# Patient Record
Sex: Male | Born: 1960 | Race: Black or African American | Hispanic: No | Marital: Married | Smoking: Former smoker
Health system: Southern US, Community
[De-identification: ages and names within clinical notes are randomized; demographics above are authoritative.]

## PROBLEM LIST (undated history)

## (undated) DIAGNOSIS — H5210 Myopia, unspecified eye: Secondary | ICD-10-CM

## (undated) DIAGNOSIS — F32A Depression, unspecified: Secondary | ICD-10-CM

## (undated) DIAGNOSIS — M1990 Unspecified osteoarthritis, unspecified site: Secondary | ICD-10-CM

## (undated) DIAGNOSIS — M4800 Spinal stenosis, site unspecified: Secondary | ICD-10-CM

## (undated) DIAGNOSIS — N529 Male erectile dysfunction, unspecified: Secondary | ICD-10-CM

## (undated) DIAGNOSIS — G629 Polyneuropathy, unspecified: Secondary | ICD-10-CM

## (undated) DIAGNOSIS — I10 Essential (primary) hypertension: Secondary | ICD-10-CM

## (undated) DIAGNOSIS — R7303 Prediabetes: Secondary | ICD-10-CM

## (undated) DIAGNOSIS — G8929 Other chronic pain: Secondary | ICD-10-CM

## (undated) DIAGNOSIS — N400 Enlarged prostate without lower urinary tract symptoms: Secondary | ICD-10-CM

## (undated) HISTORY — PX: WISDOM TOOTH EXTRACTION: SHX21

## (undated) HISTORY — PX: HERNIA REPAIR: SHX51

## (undated) HISTORY — PX: BACK SURGERY: SHX140

---

## 2019-06-23 ENCOUNTER — Other Ambulatory Visit: Payer: Self-pay

## 2019-06-23 DIAGNOSIS — Z20822 Contact with and (suspected) exposure to covid-19: Secondary | ICD-10-CM

## 2019-06-25 LAB — NOVEL CORONAVIRUS, NAA: SARS-CoV-2, NAA: NOT DETECTED

## 2019-06-26 ENCOUNTER — Telehealth: Payer: Self-pay | Admitting: General Practice

## 2019-06-26 NOTE — Telephone Encounter (Signed)
Negative COVID results given. Patient results "NOT Detected." Caller expressed understanding. ° °

## 2019-07-01 ENCOUNTER — Ambulatory Visit (HOSPITAL_COMMUNITY)
Admission: EM | Admit: 2019-07-01 | Discharge: 2019-07-01 | Disposition: A | Discharge status: Home / Self Care | Payer: Medicare Other | Attending: Family Medicine | Admitting: Family Medicine

## 2019-07-01 ENCOUNTER — Other Ambulatory Visit: Payer: Self-pay

## 2019-07-01 ENCOUNTER — Encounter (HOSPITAL_COMMUNITY): Payer: Self-pay

## 2019-07-01 DIAGNOSIS — M5441 Lumbago with sciatica, right side: Secondary | ICD-10-CM | POA: Diagnosis not present

## 2019-07-01 DIAGNOSIS — M961 Postlaminectomy syndrome, not elsewhere classified: Secondary | ICD-10-CM

## 2019-07-01 DIAGNOSIS — G8929 Other chronic pain: Secondary | ICD-10-CM

## 2019-07-01 DIAGNOSIS — Z20822 Contact with and (suspected) exposure to covid-19: Secondary | ICD-10-CM

## 2019-07-01 DIAGNOSIS — M545 Low back pain, unspecified: Secondary | ICD-10-CM

## 2019-07-01 MED ORDER — METHYLPREDNISOLONE 4 MG PO TBPK: ORAL_TABLET | ORAL | 0 refills | Status: DC

## 2019-07-01 MED ORDER — OXYCODONE HCL 10 MG PO TABS: 10.0000 mg | ORAL_TABLET | Freq: Four times a day (QID) | ORAL | 0 refills | Status: DC | PRN

## 2019-07-01 MED ORDER — METHYLPREDNISOLONE SODIUM SUCC 125 MG IJ SOLR
125.0000 mg | Freq: Once | INTRAMUSCULAR | Status: AC
  Administered 2019-07-01: 125 mg via INTRAMUSCULAR

## 2019-07-01 MED ORDER — METHYLPREDNISOLONE SODIUM SUCC 125 MG IJ SOLR
INTRAMUSCULAR | Status: AC
  Filled 2019-07-01: qty 2

## 2019-07-01 MED ORDER — TIZANIDINE HCL 4 MG PO TABS: 4.0000 mg | ORAL_TABLET | Freq: Four times a day (QID) | ORAL | 0 refills | Status: DC | PRN

## 2019-07-01 NOTE — Discharge Instructions (Signed)
Activity as tolerated Call your doctor in the morning.  She needs a report on how you are doing Take the medrol as directed, start tomorrow This is a steroid to take down the nerve inflammation Take the stronger pain medicine tonight Take instead of the hydrocodone Try the new muscle relaxer REST

## 2019-07-01 NOTE — ED Triage Notes (Addendum)
Pt states he is having back pain and right leg pain x 10 days. Pt states the pain feels like sciatica pain. Pt states his is taking hydrocodone, Aleve, methocarbamol and Topamax without symptoms improvement.

## 2019-07-01 NOTE — ED Provider Notes (Signed)
[facility]    CSN: [id number] Arrival date & time: 07/01/19  1638      History   Chief Complaint Chief Complaint  Patient presents with  . Back Pain  . Leg Pain    HPI Carlos Peterson is a 58 y.o. male.   HPI  Patient previously lived in the [place] area.  He has recently moved to the [place] area.  He has a chronic pain condition.  Has had 2 back surgeries.  He has chronic low back pain.  He has a primary care doctor in [place].  He is on a pain contract.  He gets hydrocodone monthly.  He also takes Topamax.  He states that he is on Topamax because he was on Lyrica, this did not work, he was on gabapentin, this did not work, then he was switched to Topamax, and states that this does not work either.  He also takes methocarbamol.  He feels like this is not working.  As a matter fact, he states right now nothing is working. Patient states that his back has become progressively increasingly painful over the last 10 days.  He states is to the point now where he cannot stand it.  He was unable to go to work today.  He can hardly move because of the back pain.  He is here in exam room in a wheelchair having difficulty walking because of the back and the leg pain.  He states that he did not have any real trauma, it started when he sat down with a cell phone in his pocket he felt something "bruised" in his right low back/sciatic area.  He then had pain with lifting activity.  He also had pain while sitting in unstable chair at work.  He states that all of these things have made the pain worse.  He states that he has not had pain this bad for at least 10 years.  He states that in addition to the prescription medication he has been taking Aleve.  This has not improved his symptoms.  The pain goes from the right low back and buttock down the side of his right leg all the way to his foot.  Tingling in the foot.  No weakness in the muscles.  No bowel or bladder complaint.  History reviewed. No  pertinent past medical history.  There are no active problems to display for this patient.   Past Surgical History:  Procedure Laterality Date  . BACK SURGERY    . HERNIA REPAIR         Home Medications    Prior to Admission medications   Medication Sig Start Date End Date Taking? Authorizing Provider  HYDROcodone-acetaminophen (NORCO/VICODIN) 5-325 MG tablet  06/24/19   [provider]  methocarbamol (ROBAXIN) 500 MG tablet  06/24/19   [provider]  methylPREDNISolone (MEDROL DOSEPAK) 4 MG TBPK tablet tad 07/01/19   Eustace MooreNelson, Kaoru Benda Sue, MD  Oxycodone HCl 10 MG TABS Take 1 tablet (10 mg total) by mouth every 6 (six) hours as needed. 07/01/19   Eustace MooreNelson, Charlottie Peragine Sue, MD  tamsulosin (FLOMAX) 0.4 MG CAPS capsule Take 0.4 mg by mouth daily. 04/23/19   [provider]  tiZANidine (ZANAFLEX) 4 MG tablet Take 1-2 tablets (4-8 mg total) by mouth every 6 (six) hours as needed for muscle spasms. 07/01/19   Eustace MooreNelson, Kensi Karr Sue, MD  TOPAMAX 100 MG tablet Take 100 mg by mouth 2 (two) times daily. 06/24/19   [provider]  Family History History reviewed. No pertinent family history.  Social History Social History   Tobacco Use  . Smoking status: Never Smoker  . Smokeless tobacco: Never Used  Substance Use Topics  . Alcohol use: Never    Frequency: Never  . Drug use: Never     Allergies   Patient has no known allergies.   Review of Systems Review of Systems  Constitutional: Negative for chills and fever.  HENT: Negative for ear pain and sore throat.   Eyes: Negative for pain and visual disturbance.  Respiratory: Negative for cough and shortness of breath.   Cardiovascular: Negative for chest pain and palpitations.  Gastrointestinal: Negative for abdominal pain and vomiting.  Genitourinary: Negative for dysuria and hematuria.  Musculoskeletal: Positive for back pain and gait problem. Negative for arthralgias.  Skin: Negative for color  change and rash.  Neurological: Negative for seizures and syncope.  Psychiatric/Behavioral: Positive for sleep disturbance.  All other systems reviewed and are negative.    Physical Exam Triage Vital Signs ED Triage Vitals  Enc Vitals Group     BP 07/01/19 1658 (!) 145/82     Pulse Rate 07/01/19 1658 93     Resp 07/01/19 1658 17     Temp 07/01/19 1658 98.4 F (36.9 C)     Temp Source 07/01/19 1658 Temporal     SpO2 07/01/19 1658 96 %     Weight --      Height --      Head Circumference --      Peak Flow --      Pain Score 07/01/19 1653 10     Pain Loc --      Pain Edu? --      Excl. in [place]? --    No data found.  Updated Vital Signs BP (!) 145/82 (BP Location: Left Arm)   Pulse 93   Temp 98.4 F (36.9 C) (Temporal)   Resp 17   SpO2 96%      Physical Exam Constitutional:      General: He is not in acute distress.    Appearance: He is well-developed and normal weight.     Comments: Appears uncomfortable.  Loud speech.  He has a cane that he keeps hitting on the floor to punctuate his statements regarding his pain, and that nothing works.  Angry demeanor.  Slightly argumentative  HENT:     Head: Normocephalic and atraumatic.     Mouth/Throat:     Comments: Mask in place Eyes:     Conjunctiva/sclera: Conjunctivae normal.     Pupils: Pupils are equal, round, and reactive to light.  Neck:     Musculoskeletal: Normal range of motion.  Cardiovascular:     Rate and Rhythm: Normal rate and regular rhythm.     Heart sounds: Normal heart sounds.  Pulmonary:     Effort: Pulmonary effort is normal. No respiratory distress.     Breath sounds: Normal breath sounds.  Abdominal:     General: There is no distension.     Palpations: Abdomen is soft.  Musculoskeletal: Normal range of motion.     Comments: Patient resists movement even within the chair.  He does not get out of the chair for examination.  There is tenderness at the right SI joint.  No palpable muscle spasm in the  lumbar muscles.  No palpable tenderness over the spinous processes.  Unable to elicit reflexes.  Straight leg raise is positive.  No obvious muscle weakness.  No focal neurologic/numbness noted  Skin:    General: Skin is warm and dry.  Neurological:     General: No focal deficit present.     Mental Status: He is alert.  Psychiatric:     Comments: Loud, angry, challenging      UC Treatments / Results  Labs (all labs ordered are listed, but only abnormal results are displayed) Labs Reviewed - No data to display  EKG   Radiology No results found.  Procedures Procedures (including critical care time)  Medications Ordered in UC Medications  methylPREDNISolone sodium succinate (SOLU-MEDROL) 125 mg/2 mL injection 125 mg (125 mg Intramuscular Given 07/01/19 1818)  methylPREDNISolone sodium succinate (SOLU-MEDROL) 125 mg/2 mL injection (has no administration in time range)    Initial Impression / Assessment and Plan / UC Course  I have reviewed the triage vital signs and the nursing notes.  Pertinent labs & imaging results that were available during my care of the patient were reviewed by me and considered in my medical decision making (see chart for details).     I explained to the patient that I can give him stronger pain medication than his hydrocodone, however, it might violate his pain contract.  I recommend that he call his pain doctor tomorrow and let her know that he was in the urgent care center and received stronger pain medication.  I am giving him steroids to reduce the nerve root inflammation and leg pain.  Going to change him from methocarbamol to tizanidine.  He needs to go home and rest.  Call his doctor.  Follow-up with his doctor. Patient argues that there is no way he can drive to determine his current condition.  His wife drove him here tonight.  I told him that he needs to work with his current pain provider to be transferred to a pain provider in [place].  He  may need to be seen by a spine specialist if he fails to improve with conservative treatment. Final Clinical Impressions(s) / UC Diagnoses   Final diagnoses:  Chronic low back pain, unspecified back pain laterality, unspecified whether sciatica present  Failed back syndrome of lumbar spine  Acute right-sided low back pain with right-sided sciatica     Discharge Instructions     Activity as tolerated Call your doctor in the morning.  She needs a report on how you are doing Take the medrol as directed, start tomorrow This is a steroid to take down the nerve inflammation Take the stronger pain medicine tonight Take instead of the hydrocodone Try the new muscle relaxer REST   ED Prescriptions    Medication Sig Dispense Auth. Provider   methylPREDNISolone (MEDROL DOSEPAK) 4 MG TBPK tablet tad 21 tablet Eustace Moore, MD   Oxycodone HCl 10 MG TABS Take 1 tablet (10 mg total) by mouth every 6 (six) hours as needed. 10 tablet Eustace Moore, MD   tiZANidine (ZANAFLEX) 4 MG tablet Take 1-2 tablets (4-8 mg total) by mouth every 6 (six) hours as needed for muscle spasms. 21 tablet Eustace Moore, MD     I have reviewed the PDMP during this encounter.   Eustace Moore, MD 07/01/19 2104

## 2019-07-03 ENCOUNTER — Telehealth: Payer: Self-pay

## 2019-07-03 LAB — NOVEL CORONAVIRUS, NAA: SARS-CoV-2, NAA: NOT DETECTED

## 2019-07-03 NOTE — Telephone Encounter (Signed)
Negative COVID results given. Patient results "NOT Detected." Caller expressed understanding. ° °

## 2019-10-08 ENCOUNTER — Ambulatory Visit: Payer: Medicare HMO | Attending: Internal Medicine

## 2019-10-08 DIAGNOSIS — Z20822 Contact with and (suspected) exposure to covid-19: Secondary | ICD-10-CM

## 2019-10-09 LAB — NOVEL CORONAVIRUS, NAA: SARS-CoV-2, NAA: NOT DETECTED

## 2019-12-04 ENCOUNTER — Ambulatory Visit: Payer: Medicare HMO | Attending: Internal Medicine

## 2019-12-04 DIAGNOSIS — Z23 Encounter for immunization: Secondary | ICD-10-CM

## 2019-12-04 NOTE — Progress Notes (Signed)
   Covid-19 Vaccination Clinic  Name:  Carlos Peterson    MRN: [id number] DOB: [birth year]  12/04/2019  Mr. Carlos Peterson was observed post Covid-19 immunization for 15 minutes without incident. He was provided with Vaccine Information Sheet and instruction to access the V-Safe system.   Mr. Carlos Peterson was instructed to call 911 with any severe reactions post vaccine: Marland Kitchen Difficulty breathing  . Swelling of face and throat  . A fast heartbeat  . A bad rash all over body  . Dizziness and weakness   Immunizations Administered    Name Date Dose VIS Date Route   Pfizer COVID-19 Vaccine 12/04/2019  1:17 PM 0.3 mL 08/15/2019 Intramuscular   Manufacturer: [employer], [employer]   Lot: [id number]   NDC: [address]-6

## 2019-12-16 ENCOUNTER — Encounter (HOSPITAL_COMMUNITY): Payer: Self-pay

## 2019-12-16 ENCOUNTER — Ambulatory Visit (HOSPITAL_COMMUNITY)
Admission: EM | Admit: 2019-12-16 | Discharge: 2019-12-16 | Disposition: A | Discharge status: Home / Self Care | Payer: Medicare HMO

## 2019-12-16 ENCOUNTER — Other Ambulatory Visit: Payer: Self-pay

## 2019-12-16 DIAGNOSIS — B349 Viral infection, unspecified: Secondary | ICD-10-CM

## 2019-12-16 NOTE — Discharge Instructions (Signed)
Take 2 extra tylenol every 8 hours for headache, bodyache and fever  If you are feeling worse, have shortness of breath, bad vomiting or diarrhea please go to the Emergency Department

## 2019-12-16 NOTE — ED Triage Notes (Signed)
Pt c/o fever, chills, fatigue, HA, neck pain, body aches. Denies sore throat, cough, SOB, abdom pain, n/v/d. Pt reports Tmax of 103 over the past couple of days. Last dose of tylenol Sunday.

## 2019-12-16 NOTE — ED Provider Notes (Addendum)
[facility]    CSN: [id number] Arrival date & time: 12/16/19  1753      History   Chief Complaint Chief Complaint  Patient presents with  . Fever  . Fatigue    HPI Carlos Peterson is a 59 y.o. male.   Patient reports urgent care for headache, body ache, fever and chills.  He is also reporting neck pain.  Reports his headache has been severe and present for about 6 days.  Temperature max has been up to 103 but has been well maintained with Tylenol.  Was the headache is primarily frontal.  Reports light does make it worse at times.  He has had fever and chills the entirety of this duration.  He has had some neck pain develop over the last couple days, however this has not been severe.  He believes this is been related to his body aches.  Does report fatigue but denies significant lethargy.  Denies confusion or other worrying abnormal behavior.  Denies rash.  Patient denies cough, congestion, shortness of breath, abdominal pain, diarrhea, nausea, vomiting.  Denies painful urination, urgency or frequency urination.  He has had some change in taste and smell.  He reports he had a Covid test at [facility] prior to coming here and is awaiting those results.  Patient is been taking Tylenol and ibuprofen for his headache.  Patient tolerating fluids  He was advised to be evaluated in person by his primary care.  Follow-up with telemedicine today recent surgery  Patient has not taken Tylenol today.     History reviewed. No pertinent past medical history.  There are no problems to display for this patient.   Past Surgical History:  Procedure Laterality Date  . BACK SURGERY    . HERNIA REPAIR         Home Medications    Prior to Admission medications   Medication Sig Start Date End Date Taking? Authorizing Provider  acetaminophen (TYLENOL) 650 MG CR tablet Take by mouth.   Yes [provider]  clonazePAM Bobbye [place]) 1 MG tablet  07/12/15  Yes [provider]    gabapentin (NEURONTIN) 100 MG capsule Take 2-3 capsules night; 1-2 capsule in the morning and afternoon as needed for pain 10/24/19  Yes [provider]  CELEBREX 200 MG capsule Take 200 mg by mouth daily. 11/19/19   [provider]  HYDROcodone-acetaminophen (NORCO/VICODIN) 5-325 MG tablet  06/24/19   [provider]  Levomilnacipran HCl ER 80 MG CP24 Take by mouth.    [provider]  methocarbamol (ROBAXIN) 500 MG tablet  06/24/19   [provider]  methylPREDNISolone (MEDROL DOSEPAK) 4 MG TBPK tablet tad 07/01/19   Raylene Everts, MD  Oxycodone HCl 10 MG TABS Take 1 tablet (10 mg total) by mouth every 6 (six) hours as needed. 07/01/19   Raylene Everts, MD  tamsulosin (FLOMAX) 0.4 MG CAPS capsule Take 0.4 mg by mouth daily. 04/23/19   [provider]  tiZANidine (ZANAFLEX) 4 MG tablet Take 1-2 tablets (4-8 mg total) by mouth every 6 (six) hours as needed for muscle spasms. 07/01/19   Raylene Everts, MD  TOPAMAX 100 MG tablet Take 100 mg by mouth 2 (two) times daily. 06/24/19   [provider]    Family History Family History  Problem Relation Age of Onset  . Cancer Mother   . Cancer Father     Social History Social History   Tobacco Use  . Smoking status:  Never Smoker  . Smokeless tobacco: Never Used  Substance Use Topics  . Alcohol use: Never  . Drug use: Never     Allergies   Patient has no known allergies.   Review of Systems Review of Systems  See HPI Physical Exam Triage Vital Signs ED Triage Vitals  Enc Vitals Group     BP 12/16/19 1853 (!) 155/89     Pulse Rate 12/16/19 1853 96     Resp 12/16/19 1853 18     Temp 12/16/19 1853 (!) 100.4 F (38 C)     Temp Source 12/16/19 1853 Oral     SpO2 12/16/19 1853 98 %     Weight --      Height --      Head Circumference --      Peak Flow --      Pain Score 12/16/19 1854 6     Pain Loc --      Pain Edu? --      Excl. in [place]? --    No data  found.  Updated Vital Signs BP (!) 155/89 (BP Location: Right Arm)   Pulse 96   Temp (!) 100.4 F (38 C) (Oral)   Resp 18   SpO2 98%   Visual Acuity Right Eye Distance:   Left Eye Distance:   Bilateral Distance:    Right Eye Near:   Left Eye Near:    Bilateral Near:     Physical Exam Vitals and nursing note reviewed.  Constitutional:      General: He is not in acute distress.    Appearance: He is well-developed. He is ill-appearing.  HENT:     Head: Normocephalic and atraumatic.     Nose: Nose normal.     Mouth/Throat:     Mouth: Mucous membranes are moist.     Pharynx: Oropharynx is clear.  Eyes:     General: No scleral icterus.    Extraocular Movements: Extraocular movements intact.     Conjunctiva/sclera: Conjunctivae normal.     Pupils: Pupils are equal, round, and reactive to light.  Neck:     Comments: Able to tuck chin to chest while supine, some reported tightness in paraspinals.   No brudinski or kernigs present Cardiovascular:     Rate and Rhythm: Normal rate and regular rhythm.     Heart sounds: No murmur.  Pulmonary:     Effort: Pulmonary effort is normal. No respiratory distress.     Breath sounds: Normal breath sounds. No wheezing, rhonchi or rales.  Abdominal:     Palpations: Abdomen is soft.     Tenderness: There is no abdominal tenderness.  Musculoskeletal:     Cervical back: Neck supple. Tenderness (paraspinal TTP. no midline) present. No rigidity.     Right lower leg: No edema.     Left lower leg: No edema.     Comments: Nontender to palpation over lumbar, thoracic spine.  Are well-healed surgical scars in lumbar region.  No erythema or masses.  Skin:    General: Skin is warm and dry.     Capillary Refill: Capillary refill takes less than 2 seconds.  Neurological:     General: No focal deficit present.     Mental Status: He is alert and oriented to person, place, and time.     Cranial Nerves: No cranial nerve deficit.     Motor: No  weakness.     Gait: Gait normal.     Deep Tendon Reflexes: Reflexes  normal.  Psychiatric:        Mood and Affect: Mood normal.        Behavior: Behavior normal.        Thought Content: Thought content normal.        Judgment: Judgment normal.      UC Treatments / Results  Labs (all labs ordered are listed, but only abnormal results are displayed) Labs Reviewed - No data to display  EKG   Radiology No results found.  Procedures Procedures (including critical care time)  Medications Ordered in UC Medications - No data to display  Initial Impression / Assessment and Plan / UC Course  I have reviewed the triage vital signs and the nursing notes.  Pertinent labs & imaging results that were available during my care of the patient were reviewed by me and considered in my medical decision making (see chart for details).     #Viral illness Patient is a 59 year old man presenting with several days history of fever and body ache.  Patient has a benign lung exam with no reported shortness of breath or cough and is saturating 98%, is nontachycardic and not experiencing abdominal pain or urinary symptoms.  Though he does have neck discomfort with fever and headache, low suspicion for meningitis.  Do have suspicion for Covid despite lack of respiratory symptoms.  No indication for pneumonia, urinary or GI source of infection.  Likely this is viral at this point.  Discussed this with patient and the like for him to continue Tylenol for his fever.  Discussed that if he is not improving or is acutely worsening with regard to any of his symptoms worsen worsening rising fever, headache, also lethargy or confusion, shortness of breath or chest pains issue report to the emergency department. -Patient is currently awaiting Covid testing from outside source. -Encouraged increased p.o. hydration. -Patient is agreeable to plan symptomatic care at this time.  He agrees that he will report emergency  department if worsening symptoms Final Clinical Impressions(s) / UC Diagnoses   Final diagnoses:  Viral illness     Discharge Instructions     Take 2 extra tylenol every 8 hours for headache, bodyache and fever  If you are feeling worse, have shortness of breath, bad vomiting or diarrhea please go to the Emergency Department     ED Prescriptions    None     PDMP not reviewed this encounter.   Hermelinda Medicus, PA-C 12/17/19 0040    Ulani Degrasse, Veryl Speak, PA-C 12/17/19 0041

## 2019-12-29 ENCOUNTER — Ambulatory Visit: Payer: Medicare HMO | Attending: Internal Medicine

## 2019-12-29 DIAGNOSIS — Z23 Encounter for immunization: Secondary | ICD-10-CM

## 2019-12-29 NOTE — Progress Notes (Signed)
   Covid-19 Vaccination Clinic  Name:  Hatem Cull    MRN: [id number] DOB: [birth year]  12/29/2019  Mr. Kirn was observed post Covid-19 immunization for 15 minutes without incident. He was provided with Vaccine Information Sheet and instruction to access the V-Safe system.   Mr. Dente was instructed to call 911 with any severe reactions post vaccine: Marland Kitchen Difficulty breathing  . Swelling of face and throat  . A fast heartbeat  . A bad rash all over body  . Dizziness and weakness   Immunizations Administered    Name Date Dose VIS Date Route   Pfizer COVID-19 Vaccine 12/29/2019  1:10 PM 0.3 mL 10/29/2018 Intramuscular   Manufacturer: [employer], [employer]   Lot: [id number]   NDC: [address]-5

## 2020-08-19 ENCOUNTER — Ambulatory Visit: Payer: Medicare HMO | Admitting: Family Medicine

## 2020-12-13 ENCOUNTER — Other Ambulatory Visit: Payer: Self-pay | Admitting: Anesthesiology

## 2020-12-13 DIAGNOSIS — M545 Low back pain, unspecified: Secondary | ICD-10-CM

## 2020-12-13 DIAGNOSIS — M5136 Other intervertebral disc degeneration, lumbar region: Secondary | ICD-10-CM

## 2020-12-13 DIAGNOSIS — M961 Postlaminectomy syndrome, not elsewhere classified: Secondary | ICD-10-CM

## 2021-03-03 ENCOUNTER — Other Ambulatory Visit: Payer: Self-pay | Admitting: Urology

## 2021-04-14 ENCOUNTER — Encounter (HOSPITAL_COMMUNITY): Payer: Self-pay

## 2021-04-14 NOTE — Patient Instructions (Addendum)
DUE TO COVID-19 ONLY ONE VISITOR IS ALLOWED TO COME WITH YOU AND STAY IN THE WAITING ROOM ONLY DURING PRE OP AND PROCEDURE.   **NO VISITORS ARE ALLOWED IN THE SHORT STAY AREA OR RECOVERY ROOM!!**         Your procedure is scheduled on:  Friday, 04-22-21   Report to [facility] Main  Entrance   Report to Short Stay at 5:15 AM   [facility])    Call this number if you have problems the morning of surgery [phone]   Do not eat food :After Midnight.   May have liquids until 4:30 AM day of surgery  CLEAR LIQUID DIET  Foods Allowed                                                                     Foods Excluded  Water, Black Coffee and tea, regular and decaf               liquids that you cannot  Plain Jell-O in any flavor  (No red)                                    see through such as: Fruit ices (not with fruit pulp)                                      milk, soups, orange juice              Iced Popsicles (No red)                                      All solid food                                   Apple juices Sports drinks like Gatorade (No red) Lightly seasoned clear broth or consume(fat free) Sugar, honey syrup      Oral Hygiene is also important to reduce your risk of infection.                                    Remember - BRUSH YOUR TEETH THE MORNING OF SURGERY WITH YOUR REGULAR TOOTHPASTE   Do NOT smoke after Midnight   Take these medicines the morning of surgery with A SIP OF WATER:  Oxycodone if needed                            You may not have any metal on your body including jewelry, and body piercing             Do not wear lotions, powders, cologne, or deodorant              Men may shave face and neck.   Do not bring valuables to the hospital. [place] IS  NOT  RESPONSIBLE   FOR VALUABLES.   Contacts, dentures or bridgework may not be worn into surgery.    Patients discharged the day of surgery will not be allowed to drive  home.   Please read over the following fact sheets you were given: IF YOU HAVE QUESTIONS ABOUT YOUR PRE OP INSTRUCTIONS PLEASE CALL [phone] [facility] - Preparing for Surgery Before surgery, you can play an important role.  Because skin is not sterile, your skin needs to be as free of germs as possible.  You can reduce the number of germs on your skin by washing with CHG (chlorahexidine gluconate) soap before surgery.  CHG is an antiseptic cleaner which kills germs and bonds with the skin to continue killing germs even after washing. Please DO NOT use if you have an allergy to CHG or antibacterial soaps.  If your skin becomes reddened/irritated stop using the CHG and inform your nurse when you arrive at Short Stay. Do not shave (including legs and underarms) for at least 48 hours prior to the first CHG shower.  You may shave your face/neck.  Please follow these instructions carefully:  1.  Shower with CHG Soap the night before surgery and the  morning of surgery.  2.  If you choose to wash your hair, wash your hair first as usual with your normal  shampoo.  3.  After you shampoo, rinse your hair and body thoroughly to remove the shampoo.                             4.  Use CHG as you would any other liquid soap.  You can apply chg directly to the skin and wash.  Gently with a scrungie or clean washcloth.  5.  Apply the CHG Soap to your body ONLY FROM THE NECK DOWN.   Do   not use on face/ open                           Wound or open sores. Avoid contact with eyes, ears mouth and   genitals (private parts).                       Wash face,  Genitals (private parts) with your normal soap.             6.  Wash thoroughly, paying special attention to the area where your    surgery  will be performed.  7.  Thoroughly rinse your body with warm water from the neck down.  8.  DO NOT shower/wash with your normal soap after using and rinsing off the CHG Soap.                9.  Pat yourself  dry with a clean towel.            10.  Wear clean pajamas.            11.  Place clean sheets on your bed the night of your first shower and do not  sleep with pets. Day of Surgery : Do not apply any lotions/deodorants the morning of surgery.  Please wear clean clothes to the hospital/surgery center.  FAILURE TO FOLLOW THESE INSTRUCTIONS MAY RESULT IN THE CANCELLATION OF YOUR SURGERY  PATIENT SIGNATURE_________________________________  NURSE SIGNATURE__________________________________  ________________________________________________________________________

## 2021-04-14 NOTE — Progress Notes (Addendum)
COVID Vaccine Completed: Yes x3 Date COVID Vaccine completed: 12-04-19 & 12-29-19 Has received booster:  Yes x1 COVID vaccine manufacturer: Pfizer  Date of COVID positive in last 90 days:  No  PCP - Deneda Day, FNP Cardiologist -   Chest x-ray - N/A EKG - 04-15-21 Epic Stress Test - N/A ECHO - N/A Cardiac Cath - N/A Pacemaker/ICD device last checked: Spinal Cord Stimulator:  Sleep Study - Yes 10 years ago, neg sleep apnea CPAP - No  Fasting Blood Sugar -N/A Checks Blood Sugar _____ times a day  Blood Thinner Instructions: N/A Aspirin Instructions: Last Dose:  Activity level:  Can go up a flight of stairs and perform activities of daily living without stopping and without symptoms of chest pain or shortness of breath.       Anesthesia review: N/A  Patient denies shortness of breath, fever, cough and chest pain at PAT appointment   Patient verbalized understanding of instructions that were given to them at the PAT appointment. Patient was also instructed that they will need to review over the PAT instructions again at home before surgery.

## 2021-04-15 ENCOUNTER — Other Ambulatory Visit: Payer: Self-pay

## 2021-04-15 ENCOUNTER — Encounter (HOSPITAL_COMMUNITY)
Admission: RE | Admit: 2021-04-15 | Discharge: 2021-04-15 | Disposition: A | Discharge status: Home / Self Care | Payer: Medicare HMO | Source: Ambulatory Visit | Attending: Urology | Admitting: Urology

## 2021-04-15 ENCOUNTER — Encounter (HOSPITAL_COMMUNITY): Payer: Self-pay

## 2021-04-15 DIAGNOSIS — Z01818 Encounter for other preprocedural examination: Secondary | ICD-10-CM | POA: Diagnosis present

## 2021-04-15 HISTORY — DX: Essential (primary) hypertension: I10

## 2021-04-15 HISTORY — DX: Depression, unspecified: F32.A

## 2021-04-15 HISTORY — DX: Male erectile dysfunction, unspecified: N52.9

## 2021-04-15 HISTORY — DX: Polyneuropathy, unspecified: G62.9

## 2021-04-15 HISTORY — DX: Myopia, unspecified eye: H52.10

## 2021-04-15 HISTORY — DX: Prediabetes: R73.03

## 2021-04-15 HISTORY — DX: Benign prostatic hyperplasia without lower urinary tract symptoms: N40.0

## 2021-04-15 HISTORY — DX: Spinal stenosis, site unspecified: M48.00

## 2021-04-15 HISTORY — DX: Other chronic pain: G89.29

## 2021-04-15 LAB — BASIC METABOLIC PANEL
Anion gap: 7 (ref 5–15)
BUN: 23 mg/dL — ABNORMAL HIGH (ref 6–20)
CO2: 27 mmol/L (ref 22–32)
Calcium: 9.6 mg/dL (ref 8.9–10.3)
Chloride: 107 mmol/L (ref 98–111)
Creatinine, Ser: 1.01 mg/dL (ref 0.61–1.24)
GFR, Estimated: >60 mL/min (ref >60)
Glucose, Bld: 98 mg/dL (ref 70–99)
Potassium: 4.1 mmol/L (ref 3.5–5.1)
Sodium: 141 mmol/L (ref 135–145)

## 2021-04-15 LAB — CBC
HCT: 43.1 % (ref 39.0–52.0)
Hemoglobin: 13.8 g/dL (ref 13.0–17.0)
MCH: 28 pg (ref 26.0–34.0)
MCHC: 32 g/dL (ref 30.0–36.0)
MCV: 87.4 fL (ref 80.0–100.0)
Platelets: 322 10*3/uL (ref 150–400)
RBC: 4.93 MIL/uL (ref 4.22–5.81)
RDW: 13.8 % (ref 11.5–15.5)
WBC: 3.5 10*3/uL — ABNORMAL LOW (ref 4.0–10.5)
nRBC: 0 % (ref 0.0–0.2)

## 2021-04-15 LAB — HEMOGLOBIN A1C
Hgb A1c MFr Bld: 5.9 % — ABNORMAL HIGH (ref 4.8–5.6)
Mean Plasma Glucose: 122.63 mg/dL

## 2021-04-21 NOTE — Anesthesia Preprocedure Evaluation (Addendum)
Anesthesia Evaluation  Patient identified by MRN, date of birth, ID band Patient awake    Reviewed: Allergy & Precautions, NPO status , Patient's Chart, lab work & pertinent test results  History of Anesthesia Complications Negative for: history of anesthetic complications  Airway Mallampati: II  TM Distance: >3 FB Neck ROM: Full    Dental  (+) Partial Upper, Missing, Loose,    Pulmonary former smoker,    Pulmonary exam normal        Cardiovascular hypertension, Normal cardiovascular exam     Neuro/Psych Depression Chronic back pain on opioids    GI/Hepatic negative GI ROS, Neg liver ROS,   Endo/Other  negative endocrine ROS  Renal/GU negative Renal ROS  negative genitourinary   Musculoskeletal negative musculoskeletal ROS (+)   Abdominal   Peds  Hematology negative hematology ROS (+)   Anesthesia Other Findings Day of surgery medications reviewed with patient.  Reproductive/Obstetrics negative OB ROS                            Anesthesia Physical Anesthesia Plan  ASA: 2  Anesthesia Plan: General   Post-op Pain Management:    Induction: Intravenous  PONV Risk Score and Plan: 2 and Treatment may vary due to age or medical condition, Ondansetron, Dexamethasone and Midazolam  Airway Management Planned: LMA  Additional Equipment: None  Intra-op Plan:   Post-operative Plan: Extubation in OR  Informed Consent: I have reviewed the patients History and Physical, chart, labs and discussed the procedure including the risks, benefits and alternatives for the proposed anesthesia with the patient or authorized representative who has indicated his/her understanding and acceptance.     Dental advisory given  Plan Discussed with: CRNA  Anesthesia Plan Comments:        Anesthesia Quick Evaluation

## 2021-04-22 ENCOUNTER — Other Ambulatory Visit: Payer: Self-pay

## 2021-04-22 ENCOUNTER — Ambulatory Visit (HOSPITAL_COMMUNITY)
Admission: RE | Admit: 2021-04-22 | Discharge: 2021-04-23 | Disposition: A | Discharge status: Home / Self Care | Payer: Medicare HMO | Attending: Urology | Admitting: Urology

## 2021-04-22 ENCOUNTER — Ambulatory Visit (HOSPITAL_COMMUNITY): Payer: Medicare HMO | Admitting: Anesthesiology

## 2021-04-22 ENCOUNTER — Encounter (HOSPITAL_COMMUNITY): Payer: Self-pay | Admitting: Urology

## 2021-04-22 ENCOUNTER — Encounter (HOSPITAL_COMMUNITY)
Admission: RE | Disposition: A | Discharge status: Home / Self Care | Payer: Self-pay | Source: Home / Self Care | Attending: Urology

## 2021-04-22 DIAGNOSIS — G8929 Other chronic pain: Secondary | ICD-10-CM | POA: Diagnosis not present

## 2021-04-22 DIAGNOSIS — R3915 Urgency of urination: Secondary | ICD-10-CM | POA: Diagnosis not present

## 2021-04-22 DIAGNOSIS — N401 Enlarged prostate with lower urinary tract symptoms: Secondary | ICD-10-CM | POA: Diagnosis not present

## 2021-04-22 DIAGNOSIS — M549 Dorsalgia, unspecified: Secondary | ICD-10-CM | POA: Insufficient documentation

## 2021-04-22 DIAGNOSIS — Z20822 Contact with and (suspected) exposure to covid-19: Secondary | ICD-10-CM | POA: Diagnosis not present

## 2021-04-22 DIAGNOSIS — R3912 Poor urinary stream: Secondary | ICD-10-CM | POA: Insufficient documentation

## 2021-04-22 DIAGNOSIS — N138 Other obstructive and reflux uropathy: Secondary | ICD-10-CM | POA: Diagnosis not present

## 2021-04-22 DIAGNOSIS — N400 Enlarged prostate without lower urinary tract symptoms: Secondary | ICD-10-CM | POA: Diagnosis present

## 2021-04-22 DIAGNOSIS — Z87891 Personal history of nicotine dependence: Secondary | ICD-10-CM | POA: Diagnosis not present

## 2021-04-22 HISTORY — PX: TRANSURETHRAL RESECTION OF PROSTATE: SHX73

## 2021-04-22 LAB — SURGICAL PCR SCREEN
MRSA, PCR: NEGATIVE
Staphylococcus aureus: POSITIVE — AB

## 2021-04-22 LAB — SARS CORONAVIRUS 2 BY RT PCR (HOSPITAL ORDER, PERFORMED IN ~~LOC~~ HOSPITAL LAB): SARS Coronavirus 2: NEGATIVE

## 2021-04-22 SURGERY — TURP (TRANSURETHRAL RESECTION OF PROSTATE)
Anesthesia: General

## 2021-04-22 MED ORDER — CHLORHEXIDINE GLUCONATE CLOTH 2 % EX PADS
6.0000 | MEDICATED_PAD | Freq: Every day | CUTANEOUS | Status: DC
  Administered 2021-04-22 – 2021-04-23 (×2): 6 via TOPICAL

## 2021-04-22 MED ORDER — DIPHENHYDRAMINE HCL 50 MG PO CAPS
50.0000 mg | ORAL_CAPSULE | Freq: Every day | ORAL | Status: DC
  Administered 2021-04-22: 50 mg via ORAL
  Filled 2021-04-22: qty 1

## 2021-04-22 MED ORDER — DIPHENHYDRAMINE HCL (SLEEP) 25 MG PO CAPS: 50.0000 mg | ORAL_CAPSULE | Freq: Every day | ORAL | Status: DC

## 2021-04-22 MED ORDER — ACETAMINOPHEN 325 MG PO TABS: 650.0000 mg | ORAL_TABLET | ORAL | Status: DC | PRN

## 2021-04-22 MED ORDER — HYDROXYZINE HCL 50 MG PO TABS
50.0000 mg | ORAL_TABLET | Freq: Every day | ORAL | Status: DC
  Administered 2021-04-22: 50 mg via ORAL
  Filled 2021-04-22: qty 1

## 2021-04-22 MED ORDER — LIDOCAINE 2% (20 MG/ML) 5 ML SYRINGE
INTRAMUSCULAR | Status: DC | PRN
  Administered 2021-04-22: 100 mg via INTRAVENOUS

## 2021-04-22 MED ORDER — PROMETHAZINE HCL 25 MG/ML IJ SOLN
6.2500 mg | INTRAMUSCULAR | Status: DC | PRN
Start: 2021-04-22 — End: 2021-04-22

## 2021-04-22 MED ORDER — MIDAZOLAM HCL 2 MG/2ML IJ SOLN
INTRAMUSCULAR | Status: AC
  Filled 2021-04-22: qty 2

## 2021-04-22 MED ORDER — OXYCODONE HCL 5 MG PO TABS: 10.0000 mg | ORAL_TABLET | Freq: Once | ORAL | Status: DC | PRN

## 2021-04-22 MED ORDER — PROPOFOL 10 MG/ML IV BOLUS
INTRAVENOUS | Status: DC | PRN
  Administered 2021-04-22: 200 mg via INTRAVENOUS

## 2021-04-22 MED ORDER — ACETAMINOPHEN 500 MG PO TABS
1000.0000 mg | ORAL_TABLET | Freq: Once | ORAL | Status: AC
  Administered 2021-04-22: 1000 mg via ORAL
  Filled 2021-04-22: qty 2

## 2021-04-22 MED ORDER — ZOLPIDEM TARTRATE 5 MG PO TABS
5.0000 mg | ORAL_TABLET | Freq: Every evening | ORAL | Status: DC | PRN
  Administered 2021-04-23: 5 mg via ORAL
  Filled 2021-04-22: qty 1

## 2021-04-22 MED ORDER — DIPHENHYDRAMINE HCL 12.5 MG/5ML PO ELIX: 12.5000 mg | ORAL_SOLUTION | Freq: Four times a day (QID) | ORAL | Status: DC | PRN

## 2021-04-22 MED ORDER — METHOCARBAMOL 500 MG PO TABS
500.0000 mg | ORAL_TABLET | Freq: Three times a day (TID) | ORAL | Status: DC | PRN
  Administered 2021-04-22: 500 mg via ORAL
  Filled 2021-04-22: qty 1

## 2021-04-22 MED ORDER — FENTANYL CITRATE (PF) 100 MCG/2ML IJ SOLN
INTRAMUSCULAR | Status: AC
  Filled 2021-04-22: qty 2

## 2021-04-22 MED ORDER — OXYCODONE HCL 10 MG PO TABS
10.0000 mg | ORAL_TABLET | Freq: Three times a day (TID) | ORAL | Status: DC
Start: 2021-04-22 — End: 2021-04-22

## 2021-04-22 MED ORDER — DIPHENHYDRAMINE HCL 50 MG/ML IJ SOLN: 12.5000 mg | Freq: Four times a day (QID) | INTRAMUSCULAR | Status: DC | PRN

## 2021-04-22 MED ORDER — ONDANSETRON HCL 4 MG/2ML IJ SOLN: 4.0000 mg | INTRAMUSCULAR | Status: DC | PRN

## 2021-04-22 MED ORDER — AMITRIPTYLINE HCL 25 MG PO TABS
25.0000 mg | ORAL_TABLET | Freq: Every day | ORAL | Status: DC
  Administered 2021-04-22: 25 mg via ORAL
  Filled 2021-04-22: qty 1

## 2021-04-22 MED ORDER — OXYCODONE HCL 5 MG PO TABS
10.0000 mg | ORAL_TABLET | Freq: Three times a day (TID) | ORAL | Status: DC
  Administered 2021-04-22 – 2021-04-23 (×3): 10 mg via ORAL
  Filled 2021-04-22 (×3): qty 2

## 2021-04-22 MED ORDER — OXYCODONE HCL 5 MG/5ML PO SOLN: 10.0000 mg | Freq: Once | ORAL | Status: DC | PRN

## 2021-04-22 MED ORDER — EPHEDRINE SULFATE-NACL 50-0.9 MG/10ML-% IV SOSY
PREFILLED_SYRINGE | INTRAVENOUS | Status: DC | PRN
  Administered 2021-04-22: 5 mg via INTRAVENOUS

## 2021-04-22 MED ORDER — TRAZODONE HCL 100 MG PO TABS
100.0000 mg | ORAL_TABLET | Freq: Every day | ORAL | Status: DC
  Administered 2021-04-22: 100 mg via ORAL
  Filled 2021-04-22: qty 1

## 2021-04-22 MED ORDER — ONDANSETRON HCL 4 MG/2ML IJ SOLN
INTRAMUSCULAR | Status: DC | PRN
  Administered 2021-04-22: 4 mg via INTRAVENOUS

## 2021-04-22 MED ORDER — METHOCARBAMOL 500 MG PO TABS: 500.0000 mg | ORAL_TABLET | ORAL | Status: DC

## 2021-04-22 MED ORDER — FENTANYL CITRATE (PF) 100 MCG/2ML IJ SOLN
INTRAMUSCULAR | Status: DC | PRN
  Administered 2021-04-22: 100 ug via INTRAVENOUS

## 2021-04-22 MED ORDER — SODIUM CHLORIDE 0.9 % IR SOLN
Status: DC | PRN
  Administered 2021-04-22 (×5): 3000 mL via INTRAVESICAL

## 2021-04-22 MED ORDER — EPHEDRINE 5 MG/ML INJ
INTRAVENOUS | Status: AC
  Filled 2021-04-22: qty 5

## 2021-04-22 MED ORDER — MIDAZOLAM HCL 5 MG/5ML IJ SOLN
INTRAMUSCULAR | Status: DC | PRN
  Administered 2021-04-22: 2 mg via INTRAVENOUS

## 2021-04-22 MED ORDER — FENTANYL CITRATE (PF) 100 MCG/2ML IJ SOLN: 25.0000 ug | INTRAMUSCULAR | Status: DC | PRN

## 2021-04-22 MED ORDER — SODIUM CHLORIDE 0.9% FLUSH: 3.0000 mL | INTRAVENOUS | Status: DC | PRN

## 2021-04-22 MED ORDER — CHLORHEXIDINE GLUCONATE 0.12 % MT SOLN
15.0000 mL | Freq: Once | OROMUCOSAL | Status: AC
  Administered 2021-04-22: 15 mL via OROMUCOSAL

## 2021-04-22 MED ORDER — SODIUM CHLORIDE 0.9 % IR SOLN
3000.0000 mL | Status: DC
  Administered 2021-04-22: 3000 mL

## 2021-04-22 MED ORDER — OXYBUTYNIN CHLORIDE ER 5 MG PO TB24
10.0000 mg | ORAL_TABLET | Freq: Every day | ORAL | Status: DC
  Administered 2021-04-22 – 2021-04-23 (×2): 10 mg via ORAL
  Filled 2021-04-22 (×2): qty 2

## 2021-04-22 MED ORDER — ONDANSETRON HCL 4 MG/2ML IJ SOLN
INTRAMUSCULAR | Status: AC
  Filled 2021-04-22: qty 2

## 2021-04-22 MED ORDER — DEXAMETHASONE SODIUM PHOSPHATE 10 MG/ML IJ SOLN
INTRAMUSCULAR | Status: DC | PRN
  Administered 2021-04-22: 5 mg via INTRAVENOUS

## 2021-04-22 MED ORDER — POTASSIUM CHLORIDE IN NACL 20-0.45 MEQ/L-% IV SOLN
INTRAVENOUS | Status: DC
  Administered 2021-04-22: 1000 mL via INTRAVENOUS
  Filled 2021-04-22 (×3): qty 1000

## 2021-04-22 MED ORDER — LIDOCAINE 2% (20 MG/ML) 5 ML SYRINGE
INTRAMUSCULAR | Status: AC
  Filled 2021-04-22: qty 5

## 2021-04-22 MED ORDER — ORAL CARE MOUTH RINSE: 15.0000 mL | Freq: Once | OROMUCOSAL | Status: AC

## 2021-04-22 MED ORDER — BELLADONNA ALKALOIDS-OPIUM 16.2-60 MG RE SUPP: 1.0000 | Freq: Four times a day (QID) | RECTAL | Status: DC | PRN

## 2021-04-22 MED ORDER — SODIUM CHLORIDE 0.9% FLUSH
3.0000 mL | Freq: Two times a day (BID) | INTRAVENOUS | Status: DC
  Administered 2021-04-23: 3 mL via INTRAVENOUS

## 2021-04-22 MED ORDER — 0.9 % SODIUM CHLORIDE (POUR BTL) OPTIME
TOPICAL | Status: DC | PRN
  Administered 2021-04-22: 1000 mL

## 2021-04-22 MED ORDER — DOCUSATE SODIUM 100 MG PO CAPS
100.0000 mg | ORAL_CAPSULE | Freq: Two times a day (BID) | ORAL | Status: DC
  Administered 2021-04-22 – 2021-04-23 (×3): 100 mg via ORAL
  Filled 2021-04-22 (×3): qty 1

## 2021-04-22 MED ORDER — MUPIROCIN 2 % EX OINT
1.0000 "application " | TOPICAL_OINTMENT | Freq: Two times a day (BID) | CUTANEOUS | Status: DC
  Administered 2021-04-22 – 2021-04-23 (×3): 1 via NASAL
  Filled 2021-04-22: qty 22

## 2021-04-22 MED ORDER — DEXAMETHASONE SODIUM PHOSPHATE 10 MG/ML IJ SOLN
INTRAMUSCULAR | Status: AC
  Filled 2021-04-22: qty 1

## 2021-04-22 MED ORDER — SODIUM CHLORIDE 0.9 % IV SOLN: 250.0000 mL | INTRAVENOUS | Status: DC | PRN

## 2021-04-22 MED ORDER — CEFAZOLIN SODIUM-DEXTROSE 2-4 GM/100ML-% IV SOLN
2.0000 g | Freq: Once | INTRAVENOUS | Status: AC
  Administered 2021-04-22: 2 g via INTRAVENOUS
  Filled 2021-04-22: qty 100

## 2021-04-22 MED ORDER — LACTATED RINGERS IV SOLN
INTRAVENOUS | Status: DC
Start: 2021-04-22 — End: 2021-04-22

## 2021-04-22 SURGICAL SUPPLY — 21 items
BAG URINE DRAIN 2000ML AR STRL (UROLOGICAL SUPPLIES) ×2 IMPLANT
BAG URO CATCHER STRL LF (MISCELLANEOUS) ×2 IMPLANT
CATH FOLEY 3WAY 30CC 22FR (CATHETERS) ×2 IMPLANT
CATH FOLEY 3WAY 30CC 24FR (CATHETERS)
CATH URTH STD 24FR FL 3W 2 (CATHETERS) IMPLANT
DRAPE FOOT SWITCH (DRAPES) ×2 IMPLANT
ELECT REM PT RETURN 15FT ADLT (MISCELLANEOUS) IMPLANT
GLOVE SURG ENC TEXT LTX SZ7.5 (GLOVE) ×2 IMPLANT
GOWN STRL REUS W/TWL LRG LVL3 (GOWN DISPOSABLE) ×2 IMPLANT
GUIDEWIRE STR DUAL SENSOR (WIRE) IMPLANT
HOLDER FOLEY CATH W/STRAP (MISCELLANEOUS) IMPLANT
IV CATH 14GX2 1/4 (CATHETERS) IMPLANT
KIT TURNOVER KIT A (KITS) ×2 IMPLANT
LOOP CUT BIPOLAR 24F LRG (ELECTROSURGICAL) IMPLANT
MANIFOLD NEPTUNE II (INSTRUMENTS) ×2 IMPLANT
PACK CYSTO (CUSTOM PROCEDURE TRAY) ×2 IMPLANT
SYR 30ML LL (SYRINGE) ×2 IMPLANT
SYR TOOMEY IRRIG 70ML (MISCELLANEOUS) ×2
SYRINGE TOOMEY IRRIG 70ML (MISCELLANEOUS) ×1 IMPLANT
TUBING CONNECTING 10 (TUBING) ×2 IMPLANT
TUBING UROLOGY SET (TUBING) ×2 IMPLANT

## 2021-04-22 NOTE — Discharge Instructions (Signed)

## 2021-04-22 NOTE — Anesthesia Postprocedure Evaluation (Signed)
Anesthesia Post Note  Patient: Carlos Peterson  Procedure(s) Performed: TRANSURETHRAL RESECTION OF THE PROSTATE (TURP)     Patient location during evaluation: PACU Anesthesia Type: General Level of consciousness: awake and alert and oriented Pain management: pain level controlled Vital Signs Assessment: post-procedure vital signs reviewed and stable Respiratory status: spontaneous breathing, nonlabored ventilation and respiratory function stable Cardiovascular status: blood pressure returned to baseline Postop Assessment: no apparent nausea or vomiting Anesthetic complications: no   No notable events documented.  Last Vitals:  Vitals:   04/22/21 0915 04/22/21 0930  BP: 123/81 130/86  Pulse: 67 66  Resp: 13 18  Temp:  36.7 C  SpO2: 97% 99%    Last Pain:  Vitals:   04/22/21 0930  TempSrc:   PainSc: 0-No pain                 Shanda Howells

## 2021-04-22 NOTE — Anesthesia Procedure Notes (Signed)
Procedure Name: LMA Insertion Date/Time: 04/22/2021 7:33 AM Performed by: Elisabeth Cara, CRNA Pre-anesthesia Checklist: Patient identified, Emergency Drugs available, Suction available, Patient being monitored and Timeout performed Patient Re-evaluated:Patient Re-evaluated prior to induction Oxygen Delivery Method: Circle system utilized Preoxygenation: Pre-oxygenation with 100% oxygen Induction Type: IV induction LMA: LMA with gastric port inserted Number of attempts: 1 Placement Confirmation: positive ETCO2 and breath sounds checked- equal and bilateral Tube secured with: Tape Dental Injury: Teeth and Oropharynx as per pre-operative assessment

## 2021-04-22 NOTE — Transfer of Care (Signed)
Immediate Anesthesia Transfer of Care Note  Patient: Carlos Peterson  Procedure(s) Performed: TRANSURETHRAL RESECTION OF THE PROSTATE (TURP)  Patient Location: PACU  Anesthesia Type:General  Level of Consciousness: awake, alert , oriented and patient cooperative  Airway & Oxygen Therapy: Patient Spontanous Breathing and Patient connected to face mask oxygen  Post-op Assessment: Report given to RN, Post -op Vital signs reviewed and stable and Patient moving all extremities  Post vital signs: Reviewed and stable  Last Vitals:  Vitals Value Taken Time  BP 125/88 04/22/21 0834  Temp    Pulse 70 04/22/21 0835  Resp 12 04/22/21 0835  SpO2 100 % 04/22/21 0835  Vitals shown include unvalidated device data.  Last Pain:  Vitals:   04/22/21 0613  TempSrc:   PainSc: 5          Complications: No notable events documented.

## 2021-04-22 NOTE — Op Note (Signed)
Operative Note  Preoperative diagnosis:  1.  BPH with bladder outlet obstruction  Postoperative diagnosis: 1.  BPH with bladder outlet obstruction  Procedure(s): 1.  Bipolar transurethral resection of prostate  Surgeon: Jettie Pagan, MD  Assistants:  None  Anesthesia:  General  Complications:  None  EBL:  75ml  Specimens: 1. Prostate chips ID Type Source Tests Collected by Time Destination  1 : prostate chips GU Prostate Chips SURGICAL PATHOLOGY Jannifer Hick, MD 04/22/2021 [phone]    Drains/Catheters: 1.  22Fr 3 way catheter with 10ml water into balloon  Intraoperative findings:   Trilobar obstructing prostate with enlarged and elevated median lobe. 4+ trabeculation. No suspicious bladder lesions. Prostate resected with excellent prostatic channel with excellent hemostasis.  Indication:  Carlos Peterson is a 60 y.o. male with BPH with bladder outlet obstruction presenting for transurethral resection of the prostate. He had a TRUS volume of 43cm^3. Uroflow demonstrated average flow rate of 3.4cc/sec. Preop cystoscopy demonstrated trilobar obstructing prostate. He has failed alpha blockers.  After thorough discussion including all relevant risk benefits and alternatives, he presents today for a bipolar TURP.  Description of procedure: The indication, alternatives, benefits and risks were discussed with the patient and informed consent was obtained.  Patient was brought to the operating room table, positioned supine, secured with a safety strap.  Pneumatic compression devices were placed on the lower extremities.  After the administration of intravenous antibiotics and general anesthesia, the patient was repositioned into the dorsal lithotomy position.  All pressure points were carefully padded.  A rectal examination was performed confirming a smooth symmetric enlarged gland.  The genitalia were prepped and draped in standard sterile manner.  A timeout was completed, verifying the correct  patient, surgical procedure and positioning prior to beginning the procedure.  Isotonic sodium chloride was used for irrigation.  A 26 French continuous-flow resectoscope sheath with the visual obturator and a 30 degree lens was advanced under direct vision into the bladder.  The anterior urethra appeared normal in its entirety. The prostatic urethra  had trilobar hyperplasia with an elevated median lobe.  On cystoscopic evaluation, his bladder capacity appeared normal, the bladder wall was noted to expand symmetrically in all dimensions.  There were no tumors, stones or foreign bodies present. The bladder was trabeculated with normal-appearing mucosa.  Both ureteral orifices were in their normal anatomic positions with clear urinary reflux noted bilaterally.  The obturator was removed and replaced by the working element with a resection loop.  The location of the ureteral orifices and the prostatic configuration were again confirmed.  Starting at the bladder neck and proceeding distally to the verumontanum a transurethral section of the prostate was performed using bipolar using energy of 4 and 5 for cutting and coagulation, respectively.  The procedure began at the bladder neck at the 5 o'clock and 7 o'clock positions and carefully carried distally to the verumontanum, resecting the intervening prostatic adenoma.  Next the left lateral lobe was resected to the level of the transverse capsular fibers.  The identical procedure was performed on the right lobe.  Attention was then directed anteriorly and the resection was completed from the 10 o'clock to 2 o'clock positions.  All bleeding vessels were fulgurated achieving meticulous hemostasis.  The bladder was irrigated with a Toomey syringe, ensuring removal of all prostate chips which were sent to pathology for evaluation.  Having completed the resection and the chips removed, we again confirmed hemostasis with the loop with coagulating current.  Upon  completion of the entire procedure, the bladder and posterior urethra were reexamined, confirming open prostatic urethra and bladder neck without evidence of bleeding or perforation.  Both ureteral orifices and the external sphincter were noted to be intact.  The resectoscope was withdrawn under direct vision and a 22 French three-way Foley catheter with a 30 cc balloon was inserted into the bladder.  The balloon was inflated with 50 cc of sterile water.  After multiple manual irrigations ensuring clear return of the irrigant, the procedure was terminated.  The catheter was attached to a drainage bag and continuous bladder irrigation was started with normal saline.  The patient was positioned supine.  At the end of the procedure, all counts were correct.  Patient tolerated the procedure well and was taken to the recovery room satisfactory condition.  Plan: Continuous bladder irrigation overnight with gentle Foley traction.  Plan to discharge home tomorrow with Foley catheter in place and void trial in the office in 3 days.   Matt R. Ardyth Kelso MD [facility]  Pager: [phone]

## 2021-04-22 NOTE — H&P (Signed)
------------------------------------------------------------   CC/HPI: Carlos Peterson is a 60 year old male seen in follow-up today for BPH with LUTS.   He reports a 2-year history of worsening lower urinary tract symptoms. His IPSS score today is 29, quality of life 5. He endorses a sensation mixed with bladder emptying, frequency, intermittency, urgency, weak flow stream and 5 time nocturia. PVR today is 12 mL. He states that he was placed on tamsulosin 0.4 mg in 07/2020. He has noted no benefit with this medication. He still voids with a significantly weak flow. He was changed to alfuzosin and has seen some minimal improvement.   BPH evaluation on 02/24/2021 with TRUS for size demonstrated 43cm^3 prostate. Cystoscopy demonstrated obstructing trilobar prostate with elevated bladder neck. Uroflow demonstrated average flow rate of 3.4 cc/SEC and flow time greater than 60 seconds. PVR was 33 cc.   His I IEF score is 10.   His most recent PSA on 03/10/2020 was normal at 0.92. He denies a family history of prostate cancer.   He does have a history of a spinal fixation done in 10/2019.   Patient currently denies fever, chills, sweats, nausea, vomiting, abdominal or flank pain, gross hematuria or dysuria.   03/25/2021: 60 year old male who presents today with concerns of urinary urgency, frequency and incontinence. He reports that ever since he had sex 1 week ago he has not been able to hold his urine and he is going 10 times a day and 10 times at night. He also reports he is unable to make it to the bathroom fully. His PVR today is 44 mL. He has stopped taking alfuzosin as well as Myrbetriq as he found this to be not beneficial for him. He reports that after he was treated for infection his symptoms were good and he was sleeping well and not getting up often at this time he was on alfuzosin, , however he did have a weak stream. He reports that more bed trick just gave him a headache and made him feel like he  had a cold. He does having upcoming TURP scheduled.     ALLERGIES: nkda    MEDICATIONS: None   GU PSH: Complex Uroflow - 02/23/2021 Cystoscopy - 02/23/2021       PSH Notes: Pt has had 3 back surgeries and 3 hernia repairs    NON-GU PSH: Back Surgery (Unspecified) - 01/08/2020 Hernia Repair - 2017     GU PMH: BPH w/LUTS - 02/23/2021, - 01/07/2021 Encounter for Prostate Cancer screening - 02/23/2021, (Stable), - 01/07/2021, - 12/20/2020 Weak Urinary Stream - 02/23/2021, - 01/07/2021 Urinary Frequency - 01/07/2021 ED due to arterial insufficiency - 12/20/2020 Urinary Urgency - 12/20/2020    NON-GU PMH: Arthritis    FAMILY HISTORY: 3 Son's - Runs in Family Cancer - Mother stroke - Father   SOCIAL HISTORY: Marital Status: Married Current Smoking Status: Patient does not smoke anymore. Has not smoked since 01/03/2011. Smoked for 30 years.   Tobacco Use Assessment Completed: Used Tobacco in last 30 days? Has never drank.  Does not drink caffeine.    REVIEW OF SYSTEMS:    GU Review Male:   Patient reports frequent urination, hard to postpone urination, get up at night to urinate, leakage of urine, and stream starts and stops. Patient denies trouble starting your stream, erection problems, and penile pain.  Gastrointestinal (Upper):   Patient denies nausea, indigestion/ heartburn, and vomiting.  Gastrointestinal (Lower):   Patient denies diarrhea and constipation.  Constitutional:   Patient reports fatigue. Patient  denies fever, night sweats, and weight loss.  Skin:   Patient denies skin rash/ lesion and itching.  Eyes:   Patient denies blurred vision and double vision.  Ears/ Nose/ Throat:   Patient denies sore throat and sinus problems.  Hematologic/Lymphatic:   Patient denies swollen glands and easy bruising.  Cardiovascular:   Patient denies leg swelling and chest pains.  Musculoskeletal:   Patient denies back pain and joint pain.  Neurological:   Patient denies headaches and dizziness.   Psychologic:   Patient denies depression and anxiety.   VITAL SIGNS:      03/25/2021 02:08 PM  Weight 205 lb / 92.99 kg  Height 72 in / 182.88 cm  BP 118/73 mmHg  Pulse 83 /min  Temperature 98.0 F / 36.6 C  BMI 27.8 kg/m   MULTI-SYSTEM PHYSICAL EXAMINATION:    Constitutional: Well-nourished. No physical deformities. Normally developed. Good grooming.  Respiratory: No labored breathing, no use of accessory muscles.   Cardiovascular: Normal temperature, normal extremity pulses, no swelling, no varicosities.  Skin: No paleness, no jaundice, no cyanosis. No lesion, no ulcer, no rash.  Neurologic / Psychiatric: Oriented to time, oriented to place, oriented to person. No depression, no anxiety, no agitation.  Gastrointestinal: No mass, no tenderness, no rigidity, non obese abdomen.     Complexity of Data:  Source Of History:  Patient  Records Review:   Previous Doctor Records, Previous Patient Records  Urine Test Review:   Urinalysis  Urodynamics Review:   Review Bladder Scan   03/25/21  Urinalysis  Urine Appearance Clear   Urine Color Yellow   Urine Glucose Neg mg/dL  Urine Bilirubin Neg mg/dL  Urine Ketones Neg mg/dL  Urine Specific Gravity 1.020   Urine Blood Neg ery/uL  Urine pH 7.5   Urine Protein Trace mg/dL  Urine Urobilinogen 0.2 mg/dL  Urine Nitrites Neg   Urine Leukocyte Esterase Neg leu/uL   PROCEDURES:         PVR Ultrasound - 78295         Urinalysis Dipstick Dipstick Cont'd  Color: Yellow Bilirubin: Neg mg/dL  Appearance: Clear Ketones: Neg mg/dL  Specific Gravity: 6.213 Blood: Neg ery/uL  pH: 7.5 Protein: Trace mg/dL  Glucose: Neg mg/dL Urobilinogen: 0.2 mg/dL    Nitrites: Neg    Leukocyte Esterase: Neg leu/uL    ASSESSMENT:      ICD-10 Details  1 GU:   Urinary Frequency - R35.0 Chronic, Exacerbation  2   Weak Urinary Stream - R39.12 Chronic, Exacerbation  3   Urinary Urgency - R39.15 Chronic, Exacerbation   PLAN:            Medications New  Meds: Toviaz 8 mg tablet, extended release 24 hr 1 tablet PO Q HS   #30  0 Refill(s)            Document Letter(s):  Created for Patient: Clinical Summary         Notes:   Urine is clear today. PVR is . Advised a trial of Toviaz to be started at night. He was given samples of this. I also encouraged that he restart Alfusozin for weak stream. He has upcoming TURP scheduled and he should keep this. IF urgency and frequency continue may consider PFPT, PTNS.         Next Appointment:      Next Appointment: 04/08/2021 01:45 PM    Appointment Type: Office Visit Established Patient    Location: [facility] [phone]  Provider: Bartholomew Crews, NP    Reason for Visit: 2 wk ov    Urology Preoperative H&P   Chief Complaint: BPH with LUTS  History of Present Illness: Joal Eakle is a 60 y.o. male with BPH with LUTS here for TURP.    Past Medical History:  Diagnosis Date   BPH (benign prostatic hyperplasia)    Chronic pain    Depression    Erectile dysfunction    Hypertension    Myopia    Peripheral polyneuropathy    Pre-diabetes    Spinal stenosis     Past Surgical History:  Procedure Laterality Date   BACK SURGERY     HERNIA REPAIR      Allergies: No Known Allergies  Family History  Problem Relation Age of Onset   Cancer Mother    Cancer Father     Social History:  reports that he quit smoking about 11 years ago. His smoking use included cigarettes. He has a 30.00 pack-year smoking history. He has never used smokeless tobacco. He reports that he does not drink alcohol and does not use drugs.  ROS: A complete review of systems was performed.  All systems are negative except for pertinent findings as noted.  Physical Exam:  Vital signs in last 24 hours: Temp:  [97.8 F (36.6 C)] 97.8 F (36.6 C) (08/19 0609) Pulse Rate:  [79] 79 (08/19 0609) Resp:  [16] 16 (08/19 0609) BP: (134)/(84) 134/84 (08/19 0609) SpO2:  [100 %] 100 % (08/19  0609) Weight:  [89.8 kg] 89.8 kg (08/19 4098) Constitutional:  Alert and oriented, No acute distress Cardiovascular: Regular rate and rhythm Respiratory: Normal respiratory effort, Lungs clear bilaterally GI: Abdomen is soft, nontender, nondistended, no abdominal masses GU: No CVA tenderness Lymphatic: No lymphadenopathy Neurologic: Grossly intact, no focal deficits Psychiatric: Normal mood and affect  Laboratory Data:  No results for input(s): WBC, HGB, HCT, PLT in the last 72 hours.  No results for input(s): NA, K, CL, GLUCOSE, BUN, CALCIUM, CREATININE in the last 72 hours.  Invalid input(s): CO3   Results for orders placed or performed during the hospital encounter of 04/22/21 (from the past 24 hour(s))  SARS Coronavirus 2 by RT PCR (hospital order, performed in [facility] hospital lab) Nasopharyngeal Nasopharyngeal Swab     Status: None   Collection Time: 04/22/21  6:01 AM   Specimen: Nasopharyngeal Swab  Result Value Ref Range   SARS Coronavirus 2 NEGATIVE NEGATIVE   Recent Results (from the past 240 hour(s))  SARS Coronavirus 2 by RT PCR (hospital order, performed in [facility] hospital lab) Nasopharyngeal Nasopharyngeal Swab     Status: None   Collection Time: 04/22/21  6:01 AM   Specimen: Nasopharyngeal Swab  Result Value Ref Range Status   SARS Coronavirus 2 NEGATIVE NEGATIVE Final    Comment: (NOTE) SARS-CoV-2 target nucleic acids are NOT DETECTED.  The SARS-CoV-2 RNA is generally detectable in upper and lower respiratory specimens during the acute phase of infection. The lowest concentration of SARS-CoV-2 viral copies this assay can detect is 250 copies / mL. A negative result does not preclude SARS-CoV-2 infection and should not be used as the sole basis for treatment or other patient management decisions.  A negative result may occur with improper specimen collection / handling, submission of specimen other than nasopharyngeal swab, presence of viral  mutation(s) within the areas targeted by this assay, and inadequate number of viral copies (<250 copies / mL). A negative result must be combined with  clinical observations, patient history, and epidemiological information.  Fact Sheet for Patients:   BoilerBrush.com.cyhttps://www.fda.gov/media/136312/download  Fact Sheet for Healthcare Providers: https://pope.com/https://www.fda.gov/media/136313/download  This test is not yet approved or  cleared by the [place] FDA and has been authorized for detection and/or diagnosis of SARS-CoV-2 by FDA under an Emergency Use Authorization (EUA).  This EUA will remain in effect (meaning this test can be used) for the duration of the COVID-19 declaration under Section 564(b)(1) of the Act, 21 U.S.C. section 360bbb-3(b)(1), unless the authorization is terminated or revoked sooner.  Performed at [facility], [address] [place], [place] [address]     Renal Function: Recent Labs    04/15/21 1454  CREATININE 1.01   Estimated Creatinine Clearance: 86.4 mL/min (by C-G formula based on SCr of 1.01 mg/dL).  Radiologic Imaging: No results found.  I independently reviewed the above imaging studies.  Assessment and Plan Cecille AverRobert Ausmus is a 60 y.o. male with BPH with LUTS here for TURP.    Risks and benefits of Transurethral resection of the Prostate were reviewed in detail including infection, bleeding, blood transfusion, injury to bladder/urethra/surrounding structures, erectile dysfunction, urinary incontinence, bladder neck contracture, persistent obstructive and irritative voiding symptoms, and global anesthesia risks including but not limited to CVA, MI, DVT, PE, pneumonia, and death.  He expressed understanding and desire to proceed.   Matt R. Konstantinos Cordoba MD 04/22/2021, 7:15 AM  [facility] Pager: [phone](336): [phone]

## 2021-04-22 NOTE — Progress Notes (Signed)
Day of Surgery Subjective: Pain controlled, no nausea or emesis.  Objective: Vital signs in last 24 hours: Temp:  [97.8 F (36.6 C)-98 F (36.7 C)] 98 F (36.7 C) (08/19 0930) Pulse Rate:  [66-79] 76 (08/19 1130) Resp:  [11-19] 19 (08/19 1130) BP: (113-134)/(77-88) 113/77 (08/19 1130) SpO2:  [96 %-100 %] 100 % (08/19 1130) Weight:  [89.8 kg] 89.8 kg (08/19 0613)  Intake/Output from previous day: No intake/output data recorded. Intake/Output this shift: Total I/O In: 1620 [I.V.:820; Other:700; IV Piggyback:100] Out: 1100 [Urine:1100]  Physical Exam:  General: Alert and oriented CV: RRR Lungs: Clear Abdomen: Soft, ND, NT Ext: NT, No erythema  Lab Results: No results for input(s): HGB, HCT in the last 72 hours. BMET No results for input(s): NA, K, CL, CO2, GLUCOSE, BUN, CREATININE, CALCIUM in the last 72 hours.   Studies/Results: No results found.  Assessment/Plan: BPH s/p TURP 04/22/2021  -Pain control prn. He has a history of chronic back pain and is prescribed narcotics. No additional pain medication should need to be prescribed at discharge. -Diet as tolerated -Urine is clear yellow. Gentle CBI overnight. Ordered to clamp tomorrow early AM. Plan to discharge home tomorrow and f/u in office on 8/22 for void trial   LOS: 0 days   Matt R. Keymoni Mccaster MD 04/22/2021, 12:06 PM [facility]  Pager: [phone]

## 2021-04-23 ENCOUNTER — Encounter (HOSPITAL_COMMUNITY): Payer: Self-pay | Admitting: Urology

## 2021-04-23 DIAGNOSIS — N401 Enlarged prostate with lower urinary tract symptoms: Secondary | ICD-10-CM | POA: Diagnosis not present

## 2021-04-23 LAB — CBC
HCT: 41.3 % (ref 39.0–52.0)
Hemoglobin: 13.7 g/dL (ref 13.0–17.0)
MCH: 28.7 pg (ref 26.0–34.0)
MCHC: 33.2 g/dL (ref 30.0–36.0)
MCV: 86.4 fL (ref 80.0–100.0)
Platelets: 279 10*3/uL (ref 150–400)
RBC: 4.78 MIL/uL (ref 4.22–5.81)
RDW: 13.5 % (ref 11.5–15.5)
WBC: 7 10*3/uL (ref 4.0–10.5)
nRBC: 0 % (ref 0.0–0.2)

## 2021-04-23 LAB — BASIC METABOLIC PANEL
Anion gap: 6 (ref 5–15)
BUN: 16 mg/dL (ref 6–20)
CO2: 24 mmol/L (ref 22–32)
Calcium: 8.6 mg/dL — ABNORMAL LOW (ref 8.9–10.3)
Chloride: 105 mmol/L (ref 98–111)
Creatinine, Ser: 0.76 mg/dL (ref 0.61–1.24)
GFR, Estimated: >60 mL/min (ref >60)
Glucose, Bld: 113 mg/dL — ABNORMAL HIGH (ref 70–99)
Potassium: 4.4 mmol/L (ref 3.5–5.1)
Sodium: 135 mmol/L (ref 135–145)

## 2021-04-23 MED ORDER — CEPHALEXIN 500 MG PO CAPS: 500.0000 mg | ORAL_CAPSULE | Freq: Two times a day (BID) | ORAL | 0 refills | Status: AC

## 2021-04-23 NOTE — Discharge Summary (Signed)
Physician Discharge Summary  Patient ID: Carlos Peterson MRN: [id number] DOB/AGE: September 20, 1960 60 y.o.  Admit date: 04/22/2021 Discharge date: 04/23/2021  Admission Diagnoses: Prostatic Hypertrophy  Discharge Diagnoses:  Active Problems:   BPH (benign prostatic hyperplasia)   Discharged Condition: good  Hospital Course: Pt underwent uncomplicated transurethral resection of prostate on 04/22/21. Path pending. Admitted overnight for observation on bladder irrigation that was weaned to off. By the AM of POD 1, he is ambulatory, tolerating PO intake, pain controlled on PO meds, and felt to be adequate for discharge. Hgb 13.7, Cr 0.76.   Consults: None  Significant Diagnostic Studies: labs: as per above  Treatments: surgery: as per above  Discharge Exam: Blood pressure 127/82, pulse 61, temperature 97.7 F (36.5 C), resp. rate 20, height 6' (1.829 m), weight 95.9 kg, SpO2 98 %.  NAD, AOx3, sleepy but pleasant Non-labored breathing room air RRR SNTND Foley in place with medium yellow urine / no clots off irrigation No c/c/e  Disposition: Discharge disposition: 01-Home or Self Care        Allergies as of 04/23/2021   No Known Allergies      Medication List     TAKE these medications    alfuzosin 10 MG 24 hr tablet Commonly known as: UROXATRAL Take 10 mg by mouth at bedtime.   amitriptyline 25 MG tablet Commonly known as: ELAVIL Take 25 mg by mouth at bedtime.   cephALEXin 500 MG capsule Commonly known as: KEFLEX Take 1 capsule (500 mg total) by mouth 2 (two) times daily for 3 days. To prevent infection with catheter in place.   fesoterodine 8 MG Tb24 tablet Commonly known as: TOVIAZ Take 8 mg by mouth at bedtime.   hydrOXYzine 50 MG tablet Commonly known as: ATARAX/VISTARIL Take 50 mg by mouth at bedtime.   methocarbamol 500 MG tablet Commonly known as: ROBAXIN Take 500 mg by mouth See admin instructions. Take 1 tablet (500 mg) by mouth up to 3 times daily    naproxen sodium 220 MG tablet Commonly known as: ALEVE Take 440 mg by mouth at bedtime.   Oxycodone HCl 10 MG Tabs Take 10 mg by mouth in the morning, at noon, and at bedtime.   traZODone 100 MG tablet Commonly known as: DESYREL Take 100 mg by mouth at bedtime.   ZzzQuil 25 MG Caps Generic drug: diphenhydrAMINE HCl (Sleep) Take 50 mg by mouth at bedtime.        Follow-up Information     [facility] Follow up on 04/25/2021.   Why: 8:15AM Contact information: [address] [place] [address] [phone]                Signed: Puneet Masoner 04/23/2021, 7:33 AM

## 2021-04-23 NOTE — Plan of Care (Signed)
  Problem: Health Behavior/Discharge Planning: Goal: Ability to manage health-related needs will improve Outcome: Progressing   Problem: Elimination: Goal: Will not experience complications related to bowel motility Outcome: Progressing   Problem: Elimination: Goal: Will not experience complications related to urinary retention Outcome: Progressing   Problem: Pain Managment: Goal: General experience of comfort will improve Outcome: Progressing   Problem: Activity: Goal: Risk for activity intolerance will decrease Outcome: Progressing

## 2021-04-23 NOTE — Progress Notes (Signed)
Pt. and wife for d/c home extensive leg bag teaching. Pt without questions. Wife present and states she knows how to put leg bag on. Cont with plan of care

## 2021-04-25 LAB — SURGICAL PATHOLOGY

## 2021-07-04 ENCOUNTER — Ambulatory Visit
Admission: RE | Admit: 2021-07-04 | Discharge: 2021-07-04 | Disposition: A | Discharge status: Home / Self Care | Payer: Medicare HMO | Source: Ambulatory Visit | Attending: Anesthesiology | Admitting: Anesthesiology

## 2021-07-04 ENCOUNTER — Other Ambulatory Visit: Payer: Self-pay

## 2021-07-04 ENCOUNTER — Ambulatory Visit
Admission: RE | Admit: 2021-07-04 | Discharge: 2021-07-04 | Disposition: A | Discharge status: Home / Self Care | Payer: Medicare HMO | Source: Ambulatory Visit | Attending: Family | Admitting: Family

## 2021-07-04 ENCOUNTER — Other Ambulatory Visit: Payer: Self-pay | Admitting: Internal Medicine

## 2021-07-04 DIAGNOSIS — R52 Pain, unspecified: Secondary | ICD-10-CM

## 2021-07-04 DIAGNOSIS — M5136 Other intervertebral disc degeneration, lumbar region: Secondary | ICD-10-CM

## 2021-07-04 DIAGNOSIS — M961 Postlaminectomy syndrome, not elsewhere classified: Secondary | ICD-10-CM

## 2021-07-04 DIAGNOSIS — M545 Low back pain, unspecified: Secondary | ICD-10-CM

## 2021-07-04 DIAGNOSIS — M51369 Other intervertebral disc degeneration, lumbar region without mention of lumbar back pain or lower extremity pain: Secondary | ICD-10-CM

## 2021-07-04 MED ORDER — GADOBENATE DIMEGLUMINE 529 MG/ML IV SOLN
18.0000 mL | Freq: Once | INTRAVENOUS | Status: AC | PRN
  Administered 2021-07-04: 18 mL via INTRAVENOUS

## 2021-09-28 ENCOUNTER — Other Ambulatory Visit: Payer: Self-pay | Admitting: Orthopaedic Surgery

## 2021-09-28 DIAGNOSIS — Z981 Arthrodesis status: Secondary | ICD-10-CM

## 2021-10-05 ENCOUNTER — Ambulatory Visit
Admission: RE | Admit: 2021-10-05 | Discharge: 2021-10-05 | Disposition: A | Discharge status: Home / Self Care | Payer: Medicare HMO | Source: Ambulatory Visit | Attending: Orthopaedic Surgery | Admitting: Orthopaedic Surgery

## 2021-10-05 ENCOUNTER — Other Ambulatory Visit: Payer: Self-pay

## 2021-10-05 DIAGNOSIS — Z981 Arthrodesis status: Secondary | ICD-10-CM

## 2021-10-05 MED ORDER — IOPAMIDOL (ISOVUE-M 200) INJECTION 41%
20.0000 mL | Freq: Once | INTRAMUSCULAR | Status: AC
  Administered 2021-10-05: 20 mL via INTRATHECAL

## 2021-10-05 MED ORDER — MEPERIDINE HCL 50 MG/ML IJ SOLN: 50.0000 mg | Freq: Once | INTRAMUSCULAR | Status: DC | PRN

## 2021-10-05 MED ORDER — ONDANSETRON HCL 4 MG/2ML IJ SOLN: 4.0000 mg | Freq: Once | INTRAMUSCULAR | Status: DC | PRN

## 2021-10-05 MED ORDER — DIAZEPAM 5 MG PO TABS: 5.0000 mg | ORAL_TABLET | Freq: Once | ORAL | Status: DC

## 2021-10-05 NOTE — Discharge Instructions (Signed)

## 2022-09-27 ENCOUNTER — Other Ambulatory Visit: Payer: Self-pay | Admitting: Adult Health

## 2022-09-27 DIAGNOSIS — Z87891 Personal history of nicotine dependence: Secondary | ICD-10-CM

## 2022-09-27 DIAGNOSIS — Z122 Encounter for screening for malignant neoplasm of respiratory organs: Secondary | ICD-10-CM

## 2022-10-06 NOTE — Progress Notes (Signed)
Sent message, via epic in basket, requesting orders in epic from surgeon.  

## 2022-10-11 ENCOUNTER — Ambulatory Visit
Admission: RE | Admit: 2022-10-11 | Discharge: 2022-10-11 | Disposition: A | Discharge status: Home / Self Care | Payer: Medicare HMO | Source: Ambulatory Visit | Attending: Adult Health | Admitting: Adult Health

## 2022-10-11 DIAGNOSIS — Z87891 Personal history of nicotine dependence: Secondary | ICD-10-CM

## 2022-10-11 DIAGNOSIS — Z122 Encounter for screening for malignant neoplasm of respiratory organs: Secondary | ICD-10-CM

## 2022-10-12 NOTE — Progress Notes (Addendum)
COVID Vaccine Completed: yes   Date of COVID positive in last 90 days: no  PCP - Gilmore [facility] Cardiologist - n/a  CT- 10/11/22 Epic Chest x-ray - n/a EKG - 07/03/22 CEW/chart Stress Test - n/a ECHO - n/a Cardiac Cath - n/a Pacemaker/ICD device last checked: n/a Spinal Cord Stimulator: n/a  Bowel Prep - no  Sleep Study - yes, negative  CPAP -   Fasting Blood Sugar - preDM Checks Blood Sugar no checks at home   Last dose of GLP1 agonist-  N/A GLP1 instructions:  N/A   Last dose of SGLT-2 inhibitors-  N/A SGLT-2 instructions: N/A   Blood Thinner Instructions: n/a Aspirin Instructions: Last Dose:  Activity level: Can go up a flight of stairs and perform activities of daily living without stopping and without symptoms of chest pain or shortness of breath.   Anesthesia review:   Patient denies shortness of breath, fever, cough and chest pain at PAT appointment  Patient verbalized understanding of instructions that were given to them at the PAT appointment. Patient was also instructed that they will need to review over the PAT instructions again at home before surgery.

## 2022-10-12 NOTE — Patient Instructions (Addendum)
SURGICAL WAITING ROOM VISITATION  Patients having surgery or a procedure may have no more than 2 support people in the waiting area - these visitors may rotate.    Children under the age of 69 must have an adult with them who is not the patient.  Due to an increase in RSV and influenza rates and associated hospitalizations, children ages 56 and under may not visit patients in [place].  If the patient needs to stay at the hospital during part of their recovery, the visitor guidelines for inpatient rooms apply. Pre-op nurse will coordinate an appropriate time for 1 support person to accompany patient in pre-op.  This support person may not rotate.    Please refer to the [facility] website for the visitor guidelines for Inpatients (after your surgery is over and you are in a regular room).    Your procedure is scheduled on: 10/24/22   Report to [facility] Main Entrance    Report to admitting at 5:15 AM   Call this number if you have problems the morning of surgery [phone]   Do not eat food :After Midnight.   After Midnight you may have the following liquids until 4:30 AM DAY OF SURGERY  Water Non-Citrus Juices (without pulp, NO RED-Apple, White grape, White cranberry) Black Coffee (NO MILK/CREAM OR CREAMERS, sugar ok)  Clear Tea (NO MILK/CREAM OR CREAMERS, sugar ok) regular and decaf                             Plain Jell-O (NO RED)                                           Fruit ices (not with fruit pulp, NO RED)                                     Popsicles (NO RED)                                                               Sports drinks like Gatorade (NO RED)                  The day of surgery:  Drink ONE (1) Pre-Surgery G2 at 4:30 AM the morning of surgery. Drink in one sitting. Do not sip.  This drink was given to you during your hospital  pre-op appointment visit. Nothing else to drink after completing the  Pre-Surgery G2.          If you  have questions, please contact your surgeon's office.   FOLLOW BOWEL PREP AND ANY ADDITIONAL PRE OP INSTRUCTIONS YOU RECEIVED FROM YOUR SURGEON'S OFFICE!!!     Oral Hygiene is also important to reduce your risk of infection.                                    Remember - BRUSH YOUR TEETH THE MORNING OF SURGERY WITH YOUR REGULAR TOOTHPASTE  DENTURES WILL BE  REMOVED PRIOR TO SURGERY PLEASE DO NOT APPLY "Poly grip" OR ADHESIVES!!!   Take these medicines the morning of surgery with A SIP OF WATER: Oxycodone   DO NOT TAKE ANY ORAL DIABETIC MEDICATIONS DAY OF YOUR SURGERY How to Manage Your Diabetes Before and After Surgery  Why is it important to control my blood sugar before and after surgery? Improving blood sugar levels before and after surgery helps healing and can limit problems. A way of improving blood sugar control is eating a healthy diet by:  Eating less sugar and carbohydrates  Increasing activity/exercise  Talking with your doctor about reaching your blood sugar goals High blood sugars (greater than 180 mg/dL) can raise your risk of infections and slow your recovery, so you will need to focus on controlling your diabetes during the weeks before surgery. Make sure that the doctor who takes care of your diabetes knows about your planned surgery including the date and location.  How do I manage my blood sugar before surgery? Check your blood sugar at least 4 times a day, starting 2 days before surgery, to make sure that the level is not too high or low. Check your blood sugar the morning of your surgery when you wake up and every 2 hours until you get to the Short Stay unit. If your blood sugar is less than 70 mg/dL, you will need to treat for low blood sugar: Do not take insulin. Treat a low blood sugar (less than 70 mg/dL) with  cup of clear juice (cranberry or apple), 4 glucose tablets, OR glucose gel. Recheck blood sugar in 15 minutes after treatment (to make sure it is  greater than 70 mg/dL). If your blood sugar is not greater than 70 mg/dL on recheck, call [phone] for further instructions. Report your blood sugar to the short stay nurse when you get to Short Stay.  If you are admitted to the hospital after surgery: Your blood sugar will be checked by the staff and you will probably be given insulin after surgery (instead of oral diabetes medicines) to make sure you have good blood sugar levels. The goal for blood sugar control after surgery is 80-180 mg/dL.  Reviewed and Endorsed by [facility] Patient Education Committee, August 2015                              You may not have any metal on your body including jewelry, and body piercing             Do not wear lotions, powders, cologne, or deodorant  Do not shave  48 hours prior to surgery.               Men may shave face and neck.   Do not bring valuables to the hospital. [place].   Contacts, glasses, dentures or bridgework may not be worn into surgery.  DO NOT [place]. PHARMACY WILL DISPENSE MEDICATIONS LISTED ON YOUR MEDICATION LIST TO YOU DURING YOUR ADMISSION [place]!    Patients discharged on the day of surgery will not be allowed to drive home.  Someone NEEDS to stay with you for the first 24 hours after anesthesia.              Please read over the following fact sheets  you were given: IF YOU HAVE QUESTIONS ABOUT YOUR PRE-OP INSTRUCTIONS PLEASE CALL [name]   If you received a COVID test during your pre-op visit  it is requested that you wear a mask when out in public, stay away from anyone that may not be feeling well and notify your surgeon if you develop symptoms. If you test positive for Covid or have been in contact with anyone that has tested positive in the last 10 days please notify you surgeon.    [place] - Preparing for Surgery Before surgery, you can play an  important role.  Because skin is not sterile, your skin needs to be as free of germs as possible.  You can reduce the number of germs on your skin by washing with CHG (chlorahexidine gluconate) soap before surgery.  CHG is an antiseptic cleaner which kills germs and bonds with the skin to continue killing germs even after washing. Please DO NOT use if you have an allergy to CHG or antibacterial soaps.  If your skin becomes reddened/irritated stop using the CHG and inform your nurse when you arrive at Short Stay. Do not shave (including legs and underarms) for at least 48 hours prior to the first CHG shower.  You may shave your face/neck.  Please follow these instructions carefully:  1.  Shower with CHG Soap the night before surgery and the  morning of surgery.  2.  If you choose to wash your hair, wash your hair first as usual with your normal  shampoo.  3.  After you shampoo, rinse your hair and body thoroughly to remove the shampoo.                             4.  Use CHG as you would any other liquid soap.  You can apply chg directly to the skin and wash.  Gently with a scrungie or clean washcloth.  5.  Apply the CHG Soap to your body ONLY FROM THE NECK DOWN.   Do   not use on face/ open                           Wound or open sores. Avoid contact with eyes, ears mouth and   genitals (private parts).                       Wash face,  Genitals (private parts) with your normal soap.             6.  Wash thoroughly, paying special attention to the area where your    surgery  will be performed.  7.  Thoroughly rinse your body with warm water from the neck down.  8.  DO NOT shower/wash with your normal soap after using and rinsing off the CHG Soap.                9.  Pat yourself dry with a clean towel.            10.  Wear clean pajamas.            11.  Place clean sheets on your bed the night of your first shower and do not  sleep with pets. Day of Surgery : Do not apply any lotions/deodorants the  morning of surgery.  Please wear clean clothes to the hospital/surgery center.  FAILURE TO FOLLOW THESE INSTRUCTIONS  MAY RESULT IN THE CANCELLATION OF YOUR SURGERY  PATIENT SIGNATURE_________________________________  NURSE SIGNATURE__________________________________  ________________________________________________________________________  Carlos Peterson  An incentive spirometer is a tool that can help keep your lungs clear and active. This tool measures how well you are filling your lungs with each breath. Taking long deep breaths may help reverse or decrease the chance of developing breathing (pulmonary) problems (especially infection) following: A long period of time when you are unable to move or be active. BEFORE THE PROCEDURE  If the spirometer includes an indicator to show your best effort, your nurse or respiratory therapist will set it to a desired goal. If possible, sit up straight or lean slightly forward. Try not to slouch. Hold the incentive spirometer in an upright position. INSTRUCTIONS FOR USE  Sit on the edge of your bed if possible, or sit up as far as you can in bed or on a chair. Hold the incentive spirometer in an upright position. Breathe out normally. Place the mouthpiece in your mouth and seal your lips tightly around it. Breathe in slowly and as deeply as possible, raising the piston or the ball toward the top of the column. Hold your breath for 3-5 seconds or for as long as possible. Allow the piston or ball to fall to the bottom of the column. Remove the mouthpiece from your mouth and breathe out normally. Rest for a few seconds and repeat Steps 1 through 7 at least 10 times every 1-2 hours when you are awake. Take your time and take a few normal breaths between deep breaths. The spirometer may include an indicator to show your best effort. Use the indicator as a goal to work toward during each repetition. After each set of 10 deep breaths, practice  coughing to be sure your lungs are clear. If you have an incision (the cut made at the time of surgery), support your incision when coughing by placing a pillow or rolled up towels firmly against it. Once you are able to get out of bed, walk around indoors and cough well. You may stop using the incentive spirometer when instructed by your caregiver.  RISKS AND COMPLICATIONS Take your time so you do not get dizzy or light-headed. If you are in pain, you may need to take or ask for pain medication before doing incentive spirometry. It is harder to take a deep breath if you are having pain. AFTER USE Rest and breathe slowly and easily. It can be helpful to keep track of a log of your progress. Your caregiver can provide you with a simple table to help with this. If you are using the spirometer at home, follow these instructions: [place] IF:  You are having difficultly using the spirometer. You have trouble using the spirometer as often as instructed. Your pain medication is not giving enough relief while using the spirometer. You develop fever of 100.5 F (38.1 C) or higher. SEEK IMMEDIATE MEDICAL CARE IF:  You cough up bloody sputum that had not been present before. You develop fever of 102 F (38.9 C) or greater. You develop worsening pain at or near the incision site. MAKE SURE YOU:  Understand these instructions. Will watch your condition. Will get help right away if you are not doing well or get worse. Document Released: 01/01/2007 Document Revised: 11/13/2011 Document Reviewed: 03/04/2007 [facility] Patient Information 2014 [place], [place].   ________________________________________________________________________

## 2022-10-13 ENCOUNTER — Encounter (HOSPITAL_COMMUNITY)
Admission: RE | Admit: 2022-10-13 | Discharge: 2022-10-13 | Disposition: A | Discharge status: Home / Self Care | Payer: Medicare HMO | Source: Ambulatory Visit | Attending: Orthopedic Surgery | Admitting: Orthopedic Surgery

## 2022-10-13 ENCOUNTER — Encounter (HOSPITAL_COMMUNITY): Payer: Self-pay

## 2022-10-13 VITALS — BP 136/94 | HR 72 | Temp 98.3°F | Resp 14 | Ht 72.0 in | Wt 208.0 lb

## 2022-10-13 DIAGNOSIS — Z01812 Encounter for preprocedural laboratory examination: Secondary | ICD-10-CM | POA: Insufficient documentation

## 2022-10-13 DIAGNOSIS — R7303 Prediabetes: Secondary | ICD-10-CM | POA: Insufficient documentation

## 2022-10-13 DIAGNOSIS — I10 Essential (primary) hypertension: Secondary | ICD-10-CM | POA: Diagnosis not present

## 2022-10-13 DIAGNOSIS — Z01818 Encounter for other preprocedural examination: Secondary | ICD-10-CM

## 2022-10-13 HISTORY — DX: Unspecified osteoarthritis, unspecified site: M19.90

## 2022-10-13 LAB — BASIC METABOLIC PANEL
Anion gap: 9 (ref 5–15)
BUN: 19 mg/dL (ref 8–23)
CO2: 25 mmol/L (ref 22–32)
Calcium: 8.6 mg/dL — ABNORMAL LOW (ref 8.9–10.3)
Chloride: 104 mmol/L (ref 98–111)
Creatinine, Ser: 0.93 mg/dL (ref 0.61–1.24)
GFR, Estimated: >60 mL/min (ref >60)
Glucose, Bld: 116 mg/dL — ABNORMAL HIGH (ref 70–99)
Potassium: 4.3 mmol/L (ref 3.5–5.1)
Sodium: 138 mmol/L (ref 135–145)

## 2022-10-13 LAB — CBC
HCT: 46 % (ref 39.0–52.0)
Hemoglobin: 14.5 g/dL (ref 13.0–17.0)
MCH: 27.6 pg (ref 26.0–34.0)
MCHC: 31.5 g/dL (ref 30.0–36.0)
MCV: 87.6 fL (ref 80.0–100.0)
Platelets: 337 10*3/uL (ref 150–400)
RBC: 5.25 MIL/uL (ref 4.22–5.81)
RDW: 14.1 % (ref 11.5–15.5)
WBC: 3.7 10*3/uL — ABNORMAL LOW (ref 4.0–10.5)
nRBC: 0 % (ref 0.0–0.2)

## 2022-10-13 LAB — SURGICAL PCR SCREEN
MRSA, PCR: NEGATIVE
Staphylococcus aureus: POSITIVE — AB

## 2022-10-13 LAB — HEMOGLOBIN A1C
Hgb A1c MFr Bld: 5.7 % — ABNORMAL HIGH (ref 4.8–5.6)
Mean Plasma Glucose: 116.89 mg/dL

## 2022-10-13 LAB — GLUCOSE, CAPILLARY: Glucose-Capillary: 103 mg/dL — ABNORMAL HIGH (ref 70–99)

## 2022-10-13 NOTE — Progress Notes (Signed)
STAPH+ results routed to Dr. Mardelle Matte

## 2022-10-23 ENCOUNTER — Other Ambulatory Visit (HOSPITAL_COMMUNITY): Payer: Self-pay | Admitting: Adult Health

## 2022-10-23 DIAGNOSIS — I272 Pulmonary hypertension, unspecified: Secondary | ICD-10-CM

## 2022-10-23 NOTE — H&P (Signed)
HIP ARTHROPLASTY ADMISSION H&P  Patient ID: Carlos Peterson MRN: [id number] DOB/AGE: 09/08/1960 62 y.o.  Chief Complaint: right hip pain.  Planned Procedure Date: 10/24/22 Medical Clearance by Gilmore Laroche     HPI: Carlos Peterson is a 62 y.o. male who presents for evaluation of djd right hip. The patient has a history of pain and functional disability in the right hip due to arthritis and has failed non-surgical conservative treatments for greater than 12 weeks to include NSAID's and/or analgesics, corticosteriod injections, and activity modification.  Onset of symptoms was gradual, starting 5 years ago with gradually worsening course since that time. The patient noted no past surgery on the right hip.  Patient currently rates pain at 10 out of 10 with activity. Patient has worsening of pain with activity and weight bearing and pain that interferes with activities of daily living.  Patient has evidence of joint space narrowing by imaging studies.  There is no active infection.  Past Medical History:  Diagnosis Date   Arthritis    BPH (benign prostatic hyperplasia)    Chronic pain    Depression    Erectile dysfunction    Hypertension    Myopia    Peripheral polyneuropathy    Pre-diabetes    Spinal stenosis    Past Surgical History:  Procedure Laterality Date   BACK SURGERY     x4   HERNIA REPAIR     TRANSURETHRAL RESECTION OF PROSTATE N/A 04/22/2021   Procedure: TRANSURETHRAL RESECTION OF THE PROSTATE (TURP);  Surgeon: Janith Lima, MD;  Location: [facility];  Service: Urology;  Laterality: N/A;   WISDOM TOOTH EXTRACTION     No Known Allergies Prior to Admission medications   Medication Sig Start Date End Date Taking? Authorizing Provider  methocarbamol (ROBAXIN) 500 MG tablet Take 500 mg by mouth every 8 (eight) hours as needed for muscle spasms. 06/24/19  Yes [provider]  Oxycodone HCl 10 MG TABS Take 10 mg by mouth every 4 (four) hours as needed (severe pain).   Yes  [provider]   Social History   Socioeconomic History   Marital status: Married    Spouse name: Not on file   Number of children: Not on file   Years of education: Not on file   Highest education level: Not on file  Occupational History   Not on file  Tobacco Use   Smoking status: Former    Packs/day: 1.00    Years: 30.00    Total pack years: 30.00    Types: Cigarettes    Quit date: 2011    Years since quitting: 13.1   Smokeless tobacco: Never  Vaping Use   Vaping Use: Never used  Substance and Sexual Activity   Alcohol use: Never   Drug use: Never   Sexual activity: Yes  Other Topics Concern   Not on file  Social History Narrative   Not on file   Social Determinants of Health   Financial Resource Strain: Not on file  Food Insecurity: Not on file  Transportation Needs: Not on file  Physical Activity: Not on file  Stress: Not on file  Social Connections: Not on file   Family History  Problem Relation Age of Onset   Cancer Mother    Cancer Father     ROS: Currently denies lightheadedness, dizziness, Fever, chills, CP, SOB.   No personal history of DVT, PE, MI, or CVA. Has partial dentures which he will remove prior to surgery. All other  systems have been reviewed and were otherwise currently negative with the exception of those mentioned in the HPI and as above.  Objective: Vitals: Ht: 6' 0"$  Wt: 217.2 lbs Temp: 98.8 BP: 139/90 Pulse: 83 O2 96% on room air.    Physical Exam: General: Alert, NAD. Trendelenberg Gait  HEENT: EOMI, Good Neck Extension  Pulm: No increased work of breathing.  Clear B/L A/P w/o crackle or wheeze.  CV: RRR, No m/g/r appreciated  GI: soft, NT, ND Neuro: Neuro without gross focal deficit.  Sensation intact distally Skin: No lesions in the area of chief complaint MSK/Surgical Site: right Hip pain with passive ROM.  5/5 strength.  NVI.    Imaging Review Plain radiographs demonstrate severe degenerative joint disease of  the right hip.   The bone quality appears to be adequate for age and reported activity level.  Preoperative templating of the joint replacement has been completed, documented, and submitted to the Operating Room personnel in order to optimize intra-operative equipment management.  Assessment: djd right hip Active Problems:   * No active hospital problems. *   Plan: Plan for Procedure(s): TOTAL HIP ARTHROPLASTY  The patient history, physical exam, clinical judgement of the provider and imaging are consistent with end stage degenerative joint disease and total joint arthroplasty is deemed medically necessary. The treatment options including medical management, injection therapy, and arthroplasty were discussed at length. The risks and benefits of Procedure(s): TOTAL HIP ARTHROPLASTY were presented and reviewed.  The risks of nonoperative treatment, versus surgical intervention including but not limited to continued pain, aseptic loosening, stiffness, dislocation/subluxation, infection, bleeding, nerve injury, blood clots, cardiopulmonary complications, morbidity, mortality, among others were discussed. The patient verbalizes understanding and wishes to proceed with the plan.  Patient is being admitted for surgery, pain control, PT, prophylactic antibiotics, VTE prophylaxis, progressive ambulation, ADL's and discharge planning.   Dental prophylaxis discussed and recommended for 2 years postoperatively.  The patient does meet the criteria for TXA which will be used perioperatively.   ASA 325 mg will be used postoperatively for DVT prophylaxis in addition to SCDs, and early ambulation. The patient is planning to be discharged home with OPPT in care of wife   Jola Baptist 10/23/2022 10:37 AM

## 2022-10-24 ENCOUNTER — Other Ambulatory Visit: Payer: Self-pay

## 2022-10-24 ENCOUNTER — Encounter (HOSPITAL_COMMUNITY): Payer: Self-pay | Admitting: Orthopedic Surgery

## 2022-10-24 ENCOUNTER — Encounter (HOSPITAL_COMMUNITY)
Admission: RE | Disposition: A | Discharge status: Home / Self Care | Payer: Self-pay | Source: Ambulatory Visit | Attending: Orthopedic Surgery

## 2022-10-24 ENCOUNTER — Ambulatory Visit (HOSPITAL_COMMUNITY): Payer: Medicare HMO | Admitting: Anesthesiology

## 2022-10-24 ENCOUNTER — Ambulatory Visit (HOSPITAL_COMMUNITY): Payer: Medicare HMO

## 2022-10-24 ENCOUNTER — Observation Stay (HOSPITAL_COMMUNITY)
Admission: RE | Admit: 2022-10-24 | Discharge: 2022-10-25 | Disposition: A | Discharge status: Home / Self Care | Payer: Medicare HMO | Source: Ambulatory Visit | Attending: Orthopedic Surgery | Admitting: Orthopedic Surgery

## 2022-10-24 ENCOUNTER — Ambulatory Visit (HOSPITAL_BASED_OUTPATIENT_CLINIC_OR_DEPARTMENT_OTHER): Payer: Medicare HMO | Admitting: Anesthesiology

## 2022-10-24 DIAGNOSIS — Z87891 Personal history of nicotine dependence: Secondary | ICD-10-CM | POA: Diagnosis not present

## 2022-10-24 DIAGNOSIS — I10 Essential (primary) hypertension: Secondary | ICD-10-CM | POA: Insufficient documentation

## 2022-10-24 DIAGNOSIS — R7303 Prediabetes: Secondary | ICD-10-CM

## 2022-10-24 DIAGNOSIS — Z79899 Other long term (current) drug therapy: Secondary | ICD-10-CM | POA: Diagnosis not present

## 2022-10-24 DIAGNOSIS — Z96641 Presence of right artificial hip joint: Secondary | ICD-10-CM

## 2022-10-24 DIAGNOSIS — M1611 Unilateral primary osteoarthritis, right hip: Principal | ICD-10-CM | POA: Insufficient documentation

## 2022-10-24 HISTORY — PX: TOTAL HIP ARTHROPLASTY: SHX124

## 2022-10-24 SURGERY — ARTHROPLASTY, HIP, TOTAL,POSTERIOR APPROACH
Anesthesia: Spinal | Site: Hip | Laterality: Right

## 2022-10-24 MED ORDER — LACTATED RINGERS IV BOLUS
500.0000 mL | Freq: Once | INTRAVENOUS | Status: AC
  Administered 2022-10-24: 500 mL via INTRAVENOUS

## 2022-10-24 MED ORDER — BISACODYL 10 MG RE SUPP: 10.0000 mg | Freq: Every day | RECTAL | Status: DC | PRN

## 2022-10-24 MED ORDER — LACTATED RINGERS IV BOLUS: 250.0000 mL | Freq: Once | INTRAVENOUS | Status: DC

## 2022-10-24 MED ORDER — OXYCODONE HCL 5 MG PO TABS
ORAL_TABLET | ORAL | Status: AC
  Filled 2022-10-24: qty 1

## 2022-10-24 MED ORDER — DEXAMETHASONE SODIUM PHOSPHATE 10 MG/ML IJ SOLN
10.0000 mg | Freq: Once | INTRAMUSCULAR | Status: AC
  Administered 2022-10-25: 10 mg via INTRAVENOUS
  Filled 2022-10-24: qty 1

## 2022-10-24 MED ORDER — ONDANSETRON HCL 4 MG/2ML IJ SOLN
INTRAMUSCULAR | Status: AC
  Filled 2022-10-24: qty 2

## 2022-10-24 MED ORDER — POLYETHYLENE GLYCOL 3350 17 G PO PACK: 17.0000 g | PACK | Freq: Every day | ORAL | Status: DC | PRN

## 2022-10-24 MED ORDER — HYDROMORPHONE HCL 1 MG/ML IJ SOLN
0.2500 mg | INTRAMUSCULAR | Status: DC | PRN
  Administered 2022-10-24: 0.5 mg via INTRAVENOUS

## 2022-10-24 MED ORDER — TRANEXAMIC ACID-NACL 1000-0.7 MG/100ML-% IV SOLN
1000.0000 mg | INTRAVENOUS | Status: AC
  Administered 2022-10-24: 1000 mg via INTRAVENOUS
  Filled 2022-10-24: qty 100

## 2022-10-24 MED ORDER — GLYCOPYRROLATE 0.2 MG/ML IJ SOLN
INTRAMUSCULAR | Status: DC | PRN
  Administered 2022-10-24: .2 mg via INTRAVENOUS

## 2022-10-24 MED ORDER — PROPOFOL 1000 MG/100ML IV EMUL
INTRAVENOUS | Status: AC
  Filled 2022-10-24: qty 100

## 2022-10-24 MED ORDER — ORAL CARE MOUTH RINSE: 15.0000 mL | Freq: Once | OROMUCOSAL | Status: AC

## 2022-10-24 MED ORDER — MIDAZOLAM HCL 2 MG/2ML IJ SOLN
INTRAMUSCULAR | Status: AC
  Filled 2022-10-24: qty 2

## 2022-10-24 MED ORDER — ACETAMINOPHEN 500 MG PO TABS
1000.0000 mg | ORAL_TABLET | Freq: Once | ORAL | Status: AC
  Administered 2022-10-24: 1000 mg via ORAL
  Filled 2022-10-24: qty 2

## 2022-10-24 MED ORDER — GLYCOPYRROLATE 0.2 MG/ML IJ SOLN
INTRAMUSCULAR | Status: AC
  Filled 2022-10-24: qty 1

## 2022-10-24 MED ORDER — PROPOFOL 10 MG/ML IV BOLUS
INTRAVENOUS | Status: DC | PRN
  Administered 2022-10-24: 50 mg via INTRAVENOUS

## 2022-10-24 MED ORDER — PHENOL 1.4 % MT LIQD: 1.0000 | OROMUCOSAL | Status: DC | PRN

## 2022-10-24 MED ORDER — KETOROLAC TROMETHAMINE 30 MG/ML IJ SOLN
INTRAMUSCULAR | Status: DC | PRN
  Administered 2022-10-24: 30 mg

## 2022-10-24 MED ORDER — BUPIVACAINE-EPINEPHRINE (PF) 0.25% -1:200000 IJ SOLN
INTRAMUSCULAR | Status: AC
  Filled 2022-10-24: qty 30

## 2022-10-24 MED ORDER — POVIDONE-IODINE 7.5 % EX SOLN: Freq: Once | CUTANEOUS | Status: DC

## 2022-10-24 MED ORDER — PHENYLEPHRINE HCL (PRESSORS) 10 MG/ML IV SOLN
INTRAVENOUS | Status: AC
  Filled 2022-10-24: qty 1

## 2022-10-24 MED ORDER — ACETAMINOPHEN 325 MG PO TABS: 325.0000 mg | ORAL_TABLET | Freq: Four times a day (QID) | ORAL | Status: DC | PRN

## 2022-10-24 MED ORDER — LACTATED RINGERS IV SOLN: INTRAVENOUS | Status: DC

## 2022-10-24 MED ORDER — DEXAMETHASONE SODIUM PHOSPHATE 10 MG/ML IJ SOLN
INTRAMUSCULAR | Status: DC | PRN
  Administered 2022-10-24: 10 mg via INTRAVENOUS

## 2022-10-24 MED ORDER — KETOROLAC TROMETHAMINE 30 MG/ML IJ SOLN
INTRAMUSCULAR | Status: AC
  Filled 2022-10-24: qty 1

## 2022-10-24 MED ORDER — ACETAMINOPHEN 500 MG PO TABS
1000.0000 mg | ORAL_TABLET | Freq: Four times a day (QID) | ORAL | Status: DC
  Administered 2022-10-25 (×3): 1000 mg via ORAL
  Filled 2022-10-24 (×3): qty 2

## 2022-10-24 MED ORDER — LACTATED RINGERS IV BOLUS
250.0000 mL | Freq: Once | INTRAVENOUS | Status: AC
  Administered 2022-10-24: 250 mL via INTRAVENOUS

## 2022-10-24 MED ORDER — HYDROMORPHONE HCL 1 MG/ML IJ SOLN
0.5000 mg | INTRAMUSCULAR | Status: DC | PRN
  Administered 2022-10-25: 1 mg via INTRAVENOUS
  Filled 2022-10-24: qty 1

## 2022-10-24 MED ORDER — PROPOFOL 500 MG/50ML IV EMUL
INTRAVENOUS | Status: DC | PRN
  Administered 2022-10-24: 100 ug/kg/min via INTRAVENOUS

## 2022-10-24 MED ORDER — BUPIVACAINE HCL (PF) 0.25 % IJ SOLN
INTRAMUSCULAR | Status: DC | PRN
  Administered 2022-10-24: 30 mL

## 2022-10-24 MED ORDER — CEFAZOLIN SODIUM-DEXTROSE 2-4 GM/100ML-% IV SOLN
2.0000 g | INTRAVENOUS | Status: AC
  Administered 2022-10-24: 2 g via INTRAVENOUS
  Filled 2022-10-24: qty 100

## 2022-10-24 MED ORDER — OXYCODONE HCL 5 MG PO TABS
10.0000 mg | ORAL_TABLET | ORAL | Status: DC | PRN
  Administered 2022-10-25 (×2): 15 mg via ORAL
  Filled 2022-10-24 (×2): qty 3

## 2022-10-24 MED ORDER — METOCLOPRAMIDE HCL 5 MG/ML IJ SOLN: 5.0000 mg | Freq: Three times a day (TID) | INTRAMUSCULAR | Status: DC | PRN

## 2022-10-24 MED ORDER — MAGNESIUM CITRATE PO SOLN: 1.0000 | Freq: Once | ORAL | Status: DC | PRN

## 2022-10-24 MED ORDER — METHOCARBAMOL 500 MG IVPB - SIMPLE MED: 500.0000 mg | Freq: Four times a day (QID) | INTRAVENOUS | Status: DC | PRN

## 2022-10-24 MED ORDER — CHLORHEXIDINE GLUCONATE 0.12 % MT SOLN
15.0000 mL | Freq: Once | OROMUCOSAL | Status: AC
  Administered 2022-10-24: 15 mL via OROMUCOSAL

## 2022-10-24 MED ORDER — ONDANSETRON HCL 4 MG PO TABS: 4.0000 mg | ORAL_TABLET | Freq: Four times a day (QID) | ORAL | Status: DC | PRN

## 2022-10-24 MED ORDER — POVIDONE-IODINE 10 % EX SWAB
2.0000 | Freq: Once | CUTANEOUS | Status: AC
  Administered 2022-10-24: 2 via TOPICAL

## 2022-10-24 MED ORDER — ALUM & MAG HYDROXIDE-SIMETH 200-200-20 MG/5ML PO SUSP: 30.0000 mL | ORAL | Status: DC | PRN

## 2022-10-24 MED ORDER — OXYCODONE HCL 5 MG PO TABS
5.0000 mg | ORAL_TABLET | ORAL | Status: DC | PRN
  Administered 2022-10-24: 10 mg via ORAL
  Administered 2022-10-24: 5 mg via ORAL
  Filled 2022-10-24: qty 2

## 2022-10-24 MED ORDER — DOCUSATE SODIUM 100 MG PO CAPS
100.0000 mg | ORAL_CAPSULE | Freq: Two times a day (BID) | ORAL | Status: DC
  Administered 2022-10-25 (×2): 100 mg via ORAL
  Filled 2022-10-24 (×2): qty 1

## 2022-10-24 MED ORDER — ONDANSETRON HCL 4 MG/2ML IJ SOLN: 4.0000 mg | Freq: Four times a day (QID) | INTRAMUSCULAR | Status: DC | PRN

## 2022-10-24 MED ORDER — PHENYLEPHRINE HCL-NACL 20-0.9 MG/250ML-% IV SOLN
INTRAVENOUS | Status: DC | PRN
  Administered 2022-10-24: 50 ug/min via INTRAVENOUS

## 2022-10-24 MED ORDER — POTASSIUM CHLORIDE IN NACL 20-0.9 MEQ/L-% IV SOLN
INTRAVENOUS | Status: DC
  Filled 2022-10-24 (×2): qty 1000

## 2022-10-24 MED ORDER — POVIDONE-IODINE 10 % EX SWAB: 2.0000 | Freq: Once | CUTANEOUS | Status: DC

## 2022-10-24 MED ORDER — LIDOCAINE HCL (PF) 2 % IJ SOLN
INTRAMUSCULAR | Status: AC
  Filled 2022-10-24: qty 5

## 2022-10-24 MED ORDER — ASPIRIN 325 MG PO TBEC
325.0000 mg | DELAYED_RELEASE_TABLET | Freq: Every day | ORAL | Status: DC
  Administered 2022-10-25: 325 mg via ORAL
  Filled 2022-10-24: qty 1

## 2022-10-24 MED ORDER — TRANEXAMIC ACID-NACL 1000-0.7 MG/100ML-% IV SOLN
INTRAVENOUS | Status: AC
  Filled 2022-10-24: qty 100

## 2022-10-24 MED ORDER — TRANEXAMIC ACID-NACL 1000-0.7 MG/100ML-% IV SOLN
1000.0000 mg | Freq: Once | INTRAVENOUS | Status: AC
  Administered 2022-10-24: 1000 mg via INTRAVENOUS

## 2022-10-24 MED ORDER — OXYCODONE HCL 5 MG/5ML PO SOLN: 5.0000 mg | Freq: Once | ORAL | Status: AC | PRN

## 2022-10-24 MED ORDER — DIPHENHYDRAMINE HCL 12.5 MG/5ML PO ELIX: 12.5000 mg | ORAL_SOLUTION | ORAL | Status: DC | PRN

## 2022-10-24 MED ORDER — PROPOFOL 500 MG/50ML IV EMUL
INTRAVENOUS | Status: AC
  Filled 2022-10-24: qty 50

## 2022-10-24 MED ORDER — DEXAMETHASONE SODIUM PHOSPHATE 10 MG/ML IJ SOLN
INTRAMUSCULAR | Status: AC
  Filled 2022-10-24: qty 1

## 2022-10-24 MED ORDER — MENTHOL 3 MG MT LOZG: 1.0000 | LOZENGE | OROMUCOSAL | Status: DC | PRN

## 2022-10-24 MED ORDER — HYDROMORPHONE HCL 1 MG/ML IJ SOLN
INTRAMUSCULAR | Status: AC
  Filled 2022-10-24: qty 1

## 2022-10-24 MED ORDER — METHOCARBAMOL 500 MG PO TABS
ORAL_TABLET | ORAL | Status: AC
  Administered 2022-10-25: 500 mg
  Filled 2022-10-24: qty 1

## 2022-10-24 MED ORDER — LIDOCAINE HCL (PF) 2 % IJ SOLN
INTRAMUSCULAR | Status: DC | PRN
  Administered 2022-10-24: 2.5 mL via INTRADERMAL

## 2022-10-24 MED ORDER — METHOCARBAMOL 500 MG PO TABS
500.0000 mg | ORAL_TABLET | Freq: Four times a day (QID) | ORAL | Status: DC | PRN
  Administered 2022-10-24: 500 mg via ORAL
  Filled 2022-10-24: qty 1

## 2022-10-24 MED ORDER — BUPIVACAINE IN DEXTROSE 0.75-8.25 % IT SOLN
INTRATHECAL | Status: DC | PRN
  Administered 2022-10-24: 2 mL via INTRATHECAL

## 2022-10-24 MED ORDER — METOCLOPRAMIDE HCL 5 MG PO TABS: 5.0000 mg | ORAL_TABLET | Freq: Three times a day (TID) | ORAL | Status: DC | PRN

## 2022-10-24 MED ORDER — MIDAZOLAM HCL 2 MG/2ML IJ SOLN
INTRAMUSCULAR | Status: DC | PRN
  Administered 2022-10-24: 2 mg via INTRAVENOUS

## 2022-10-24 MED ORDER — OXYCODONE HCL 5 MG PO TABS
5.0000 mg | ORAL_TABLET | Freq: Once | ORAL | Status: AC | PRN
  Administered 2022-10-24: 5 mg via ORAL

## 2022-10-24 MED ORDER — ONDANSETRON HCL 4 MG/2ML IJ SOLN: 4.0000 mg | Freq: Once | INTRAMUSCULAR | Status: DC | PRN

## 2022-10-24 MED ORDER — ONDANSETRON HCL 4 MG/2ML IJ SOLN
INTRAMUSCULAR | Status: DC | PRN
  Administered 2022-10-24: 4 mg via INTRAVENOUS

## 2022-10-24 SURGICAL SUPPLY — 60 items
BAG COUNTER SPONGE SURGICOUNT (BAG) IMPLANT
BIT DRILL 2.0X128 (BIT) IMPLANT
BLADE SAW SGTL 73X25 THK (BLADE) ×1 IMPLANT
CLSR STERI-STRIP ANTIMIC 1/2X4 (GAUZE/BANDAGES/DRESSINGS) ×2 IMPLANT
COVER SURGICAL LIGHT HANDLE (MISCELLANEOUS) ×1 IMPLANT
CUP SECTOR GRIPTON 58MM (Orthopedic Implant) IMPLANT
DRAPE INCISE IOBAN 66X45 STRL (DRAPES) ×1 IMPLANT
DRAPE ORTHO SPLIT 77X108 STRL (DRAPES) ×2
DRAPE POUCH INSTRU U-SHP 10X18 (DRAPES) ×1 IMPLANT
DRAPE SHEET LG 3/4 BI-LAMINATE (DRAPES) ×1 IMPLANT
DRAPE SURG 17X11 SM STRL (DRAPES) ×1 IMPLANT
DRAPE SURG ORHT 6 SPLT 77X108 (DRAPES) ×2 IMPLANT
DRAPE U-SHAPE 47X51 STRL (DRAPES) ×1 IMPLANT
DRSG MEPILEX POST OP 4X8 (GAUZE/BANDAGES/DRESSINGS) ×1 IMPLANT
DURAPREP 26ML APPLICATOR (WOUND CARE) ×2 IMPLANT
ELECT BLADE TIP CTD 4 INCH (ELECTRODE) ×1 IMPLANT
ELECT REM PT RETURN 15FT ADLT (MISCELLANEOUS) ×1 IMPLANT
ELIMINATOR HOLE APEX DEPUY (Hips) IMPLANT
FACESHIELD WRAPAROUND (MASK) ×2 IMPLANT
FACESHIELD WRAPAROUND OR TEAM (MASK) ×2 IMPLANT
GLOVE BIO SURGEON STRL SZ 6.5 (GLOVE) ×1 IMPLANT
GLOVE BIO SURGEON STRL SZ7.5 (GLOVE) ×1 IMPLANT
GLOVE BIOGEL PI IND STRL 7.0 (GLOVE) ×1 IMPLANT
GLOVE BIOGEL PI IND STRL 8 (GLOVE) ×1 IMPLANT
GOWN STRL SURGICAL XL XLNG (GOWN DISPOSABLE) ×2 IMPLANT
HEAD CERAMIC 36 PLUS5 (Hips) IMPLANT
HOOD PEEL AWAY T7 (MISCELLANEOUS) ×2 IMPLANT
K-WIRE 2.0 (WIRE) ×1
K-WIRE TROCAR PT 2.0 150MM (WIRE) ×1
KIT BASIN OR (CUSTOM PROCEDURE TRAY) ×1 IMPLANT
KIT TURNOVER KIT A (KITS) IMPLANT
KWIRE TROCAR PT 2.0 150 (WIRE) ×1 IMPLANT
KWIRE TROCAR PT 2.0 150MM (WIRE) ×1 IMPLANT
LINER NEUTRAL 36X58 PLUS4 IMPLANT
MANIFOLD NEPTUNE II (INSTRUMENTS) ×1 IMPLANT
NDL MA TROC 1/2 (NEEDLE) IMPLANT
NDL SAFETY ECLIP 18X1.5 (MISCELLANEOUS) ×2 IMPLANT
NEEDLE ANCHOR KEITH 2 7/8 STR (NEEDLE) ×1 IMPLANT
NEEDLE MA TROC 1/2 (NEEDLE) IMPLANT
NS IRRIG 1000ML POUR BTL (IV SOLUTION) ×1 IMPLANT
PACK TOTAL JOINT (CUSTOM PROCEDURE TRAY) ×1 IMPLANT
PROTECTOR NERVE ULNAR (MISCELLANEOUS) ×1 IMPLANT
SCREW 6.5MMX25MM (Screw) IMPLANT
STEM SUMMIT DUOFIX HA (Hips) IMPLANT
SUCTION FRAZIER HANDLE 12FR (TUBING) ×1
SUCTION TUBE FRAZIER 12FR DISP (TUBING) ×1 IMPLANT
SUMMIT DUOFIX STEM HA (Hips) ×1 IMPLANT
SUT ETHIBOND NAB CT1 #1 30IN (SUTURE) ×3 IMPLANT
SUT VIC AB 0 CT1 36 (SUTURE) ×1 IMPLANT
SUT VIC AB 1 CT1 36 (SUTURE) ×2 IMPLANT
SUT VIC AB 2-0 CT1 27 (SUTURE) ×4
SUT VIC AB 2-0 CT1 TAPERPNT 27 (SUTURE) ×3 IMPLANT
SUT VIC AB 3-0 SH 27 (SUTURE) ×2
SUT VIC AB 3-0 SH 27X BRD (SUTURE) IMPLANT
SUT VIC AB 3-0 SH 8-18 (SUTURE) ×1 IMPLANT
SYR CONTROL 10ML LL (SYRINGE) ×2 IMPLANT
TOWEL OR 17X26 10 PK STRL BLUE (TOWEL DISPOSABLE) ×1 IMPLANT
TRAY FOLEY MTR SLVR 16FR STAT (SET/KITS/TRAYS/PACK) ×1 IMPLANT
TUBE SUCTION HIGH CAP CLEAR NV (SUCTIONS) ×1 IMPLANT
WATER STERILE IRR 1000ML POUR (IV SOLUTION) ×2 IMPLANT

## 2022-10-24 NOTE — Anesthesia Procedure Notes (Signed)
Spinal  Patient location during procedure: OR Start time: 10/24/2022 11:07 AM End time: 10/24/2022 11:09 AM Reason for block: surgical anesthesia Staffing Performed: anesthesiologist  Anesthesiologist: Josephine Igo, MD Performed by: Josephine Igo, MD Authorized by: Josephine Igo, MD   Preanesthetic Checklist Completed: patient identified, IV checked, site marked, risks and benefits discussed, surgical consent, monitors and equipment checked, pre-op evaluation and timeout performed Spinal Block Patient position: sitting Prep: DuraPrep and site prepped and draped Patient monitoring: heart rate, cardiac monitor, continuous pulse ox and blood pressure Approach: midline Location: L3-4 Injection technique: single-shot Needle Needle type: Pencan  Needle gauge: 24 G Needle length: 9 cm Needle insertion depth: 7 cm Assessment Sensory level: T4 Events: CSF return Additional Notes Patient tolerated procedure well. Adequate sensory level.

## 2022-10-24 NOTE — Op Note (Signed)
10/24/2022  12:58 PM  PATIENT:  Carlos Peterson   MRN: [id number]  PRE-OPERATIVE DIAGNOSIS: Right hip primary localized osteoarthritis  POST-OPERATIVE DIAGNOSIS:  same  PROCEDURE:  Procedure(s): TOTAL HIP ARTHROPLASTY  PREOPERATIVE INDICATIONS:    Carlos Peterson is an 62 y.o. male who has a diagnosis of right hip primary localized osteoarthritis and elected for surgical management after failing conservative treatment.  The risks benefits and alternatives were discussed with the patient including but not limited to the risks of nonoperative treatment, versus surgical intervention including infection, bleeding, nerve injury, periprosthetic fracture, the need for revision surgery, dislocation, leg length discrepancy, blood clots, cardiopulmonary complications, morbidity, mortality, among others, and they were willing to proceed.     OPERATIVE REPORT     SURGEON:  Marchia Bond, MD    ASSISTANT:  Merlene Pulling, PA-C, (Present throughout the entire procedure,  necessary for completion of procedure in a timely manner, assisting with retraction, instrumentation, and closure)     ANESTHESIA: Spinal  ESTIMATED BLOOD LOSS: [id number] mL    COMPLICATIONS:  None.     UNIQUE ASPECTS OF THE CASE: The femoral head was extremely large, he felt like he was in a high offset.  I was able to reamed to a 5, but the 4 was fairly tight, with 1 tooth showing, I fought to try and avoid being in varus, although it was fairly challenging.  He had a strange springy tightness to the hip both before and at the end of the procedure, he did not like to adduct very much.  I cut the neck about three quarters of the thumbs breaths, and the acetabulum was fairly shallow, I did not medialize much.  I could potentially gone up to a 60, but I felt like the 58 was adequate.  COMPONENTS:  [employer] fit femur size 4 high offset with a 36 mm + 5 ceramic head ball and a Gription Acetabular shell size 58, with a single  cancellous screw for backup fixation, with an apex hole eliminator and a +4 neutral polyethylene liner.    PROCEDURE IN DETAIL:   The patient was met in the holding area and  identified.  The appropriate hip was identified and marked at the operative site.  The patient was then transported to the OR  and  placed under anesthesia.  At that point, the patient was  placed in the lateral decubitus position with the operative side up and  secured to the operating room table and all bony prominences padded.     The operative lower extremity was prepped from the iliac crest to the distal leg.  Sterile draping was performed.  Time out was performed prior to incision.      A routine posterolateral approach was utilized via sharp dissection  carried down to the subcutaneous tissue.  Gross bleeders were Bovie coagulated.  The iliotibial band was identified and incised along the length of the skin incision.  Self-retaining retractors were  inserted.  With the hip internally rotated, the short external rotators  were identified. The piriformis and capsule was tagged with FiberWire, and the hip capsule released in a T-type fashion.  The femoral neck was exposed, and I resected the femoral neck using the appropriate jig. This was performed at approximately three quarters of a thumb's breadth above the lesser trochanter.    I then exposed the deep acetabulum, cleared out any tissue including the ligamentum teres.  A wing retractor was placed.  After adequate visualization, I  excised the labrum, and then sequentially reamed.  I placed the trial acetabulum, which seated nicely, and then impacted the real cup into place.  Appropriate version and inclination was confirmed clinically matching their bony anatomy, and also with the use of the jig.  I placed a cancellous screw to augment fixation.  I was matching anteriorly, and there was an inferior posterior osteophyte that I ignored.  A trial polyethylene liner was placed  and the wing retractor removed.    I then prepared the proximal femur using the cookie-cutter, the lateralizing reamer, and then sequentially reamed and broached.  A trial broach, neck, and head was utilized, and I reduced the hip and it was found to have excellent stability with functional range of motion. The trial components were then removed, and the real polyethylene liner was placed.  I then impacted the real femoral prosthesis into place into the appropriate version, slightly anteverted to the normal anatomy, and I impacted the real head ball into place. The hip was then reduced and taken through functional range of motion and found to have excellent stability. Leg lengths were restored.  I then used a 2 mm drill bits to pass the FiberWire suture from the capsule and piriformis through the greater trochanter, and secured this. Excellent posterior capsular repair was achieved. I also closed the T in the capsule.  I then irrigated the hip copiously again with pulse lavage, and repaired the fascia with Vicryl, followed by Vicryl for the subcutaneous tissue, Monocryl for the skin, Steri-Strips and sterile gauze. The wounds were injected. The patient was then awakened and returned to PACU in stable and satisfactory condition. There were no complications.  Marchia Bond, MD Orthopedic Surgeon [phone]   10/24/2022 12:58 PM

## 2022-10-24 NOTE — Anesthesia Preprocedure Evaluation (Addendum)
Anesthesia Evaluation  Patient identified by MRN, date of birth, ID band Patient awake    Reviewed: Allergy & Precautions, NPO status , Patient's Chart, lab work & pertinent test results, reviewed documented beta blocker date and time   Airway Mallampati: II  TM Distance: >3 FB Neck ROM: Full    Dental no notable dental hx. (+) Teeth Intact, Caps, Dental Advisory Given, Loose, Missing,    Pulmonary former smoker   Pulmonary exam normal breath sounds clear to auscultation       Cardiovascular hypertension, Normal cardiovascular exam Rhythm:Regular Rate:Normal     Neuro/Psych  PSYCHIATRIC DISORDERS  Depression    Peripheral neuropathy  Neuromuscular disease    GI/Hepatic negative GI ROS, Neg liver ROS,,,  Endo/Other  Pre Diabetes  Renal/GU negative Renal ROS  negative genitourinary   Musculoskeletal  (+) Arthritis , Osteoarthritis,  OA right hip Spinal stenosis Chronic LBP Hx/o 4 previous lumbar surgeries   Abdominal   Peds  Hematology negative hematology ROS (+)   Anesthesia Other Findings   Reproductive/Obstetrics                             Anesthesia Physical Anesthesia Plan  ASA: 2  Anesthesia Plan: Spinal   Post-op Pain Management: Minimal or no pain anticipated and Regional block*   Induction: Intravenous  PONV Risk Score and Plan: 2 and Treatment may vary due to age or medical condition, Midazolam, Ondansetron and Propofol infusion  Airway Management Planned: Natural Airway and Simple Face Mask  Additional Equipment: None  Intra-op Plan:   Post-operative Plan:   Informed Consent: I have reviewed the patients History and Physical, chart, labs and discussed the procedure including the risks, benefits and alternatives for the proposed anesthesia with the patient or authorized representative who has indicated his/her understanding and acceptance.     Dental advisory  given  Plan Discussed with: CRNA and Anesthesiologist  Anesthesia Plan Comments:         Anesthesia Quick Evaluation

## 2022-10-24 NOTE — Progress Notes (Signed)
PT Note  Patient Details Name: Carlos Peterson MRN: [id number] DOB: 1961/04/17   Cancelled Treatment:   PT communicated with nursing staff 16:05 and RN indicated pt was not ready for therapy at that time. PT returned 18:15 and pt continues to present with diminished sensation B LEs L >R. Further assessment revealed pt unable to perform active L ankle DF against resistance, unable to determine light touch and cold on lateral aspect of L proximal LE and minimal glute engagement B.  Pt reports that his left leg "feels like spaghetti."  Pt ed provided on unsafe at this time to progress with PT evaluation and pt spouse present and aware. PT made RN aware and to contact orthopedic, Dr. Mardelle Matte.   Baird Lyons, PT   Adair Patter 10/24/2022, 6:33 PM

## 2022-10-24 NOTE — Discharge Instructions (Signed)
INSTRUCTIONS AFTER JOINT REPLACEMENT   Remove items at home which could result in a fall. This includes throw rugs or furniture in walking pathways ICE to the affected joint every three hours while awake for 30 minutes at a time, for at least the first 3-5 days, and then as needed for pain and swelling.  Continue to use ice for pain and swelling. You may notice swelling that will progress down to the foot and ankle.  This is normal after surgery.  Elevate your leg when you are not up walking on it.   Continue to use the breathing machine you got in the hospital (incentive spirometer) which will help keep your temperature down.  It is common for your temperature to cycle up and down following surgery, especially at night when you are not up moving around and exerting yourself.  The breathing machine keeps your lungs expanded and your temperature down.   DIET:  As you were doing prior to hospitalization, we recommend a well-balanced diet.  DRESSING / WOUND CARE / SHOWERING  You may shower 3 days after surgery, but keep the wounds dry during showering.  You may use an occlusive plastic wrap (Press'n Seal for example), NO SOAKING/SUBMERGING IN THE BATHTUB.  If the bandage gets wet, change with a clean dry gauze.  If the incision gets wet, pat the wound dry with a clean towel.  ACTIVITY  Increase activity slowly as tolerated, but follow the weight bearing instructions below.   No driving for 6 weeks or until further direction given by your physician.  You cannot drive while taking narcotics.  No lifting or carrying greater than 10 lbs. until further directed by your surgeon. Avoid periods of inactivity such as sitting longer than an hour when not asleep. This helps prevent blood clots.  You may return to work once you are authorized by your doctor.     WEIGHT BEARING   Weight bearing as tolerated with assist device (walker, cane, etc) as directed, use it as long as suggested by your surgeon or  therapist, typically at least 4-6 weeks.   EXERCISES  Results after joint replacement surgery are often greatly improved when you follow the exercise, range of motion and muscle strengthening exercises prescribed by your doctor. Safety measures are also important to protect the joint from further injury. Any time any of these exercises cause you to have increased pain or swelling, decrease what you are doing until you are comfortable again and then slowly increase them. If you have problems or questions, call your caregiver or physical therapist for advice.   Rehabilitation is important following a joint replacement. After just a few days of immobilization, the muscles of the leg can become weakened and shrink (atrophy).  These exercises are designed to build up the tone and strength of the thigh and leg muscles and to improve motion. Often times heat used for twenty to thirty minutes before working out will loosen up your tissues and help with improving the range of motion but do not use heat for the first two weeks following surgery (sometimes heat can increase post-operative swelling).   These exercises can be done on a training (exercise) mat, on the floor, on a table or on a bed. Use whatever works the best and is most comfortable for you.    Use music or television while you are exercising so that the exercises are a pleasant break in your day. This will make your life better with the exercises acting   as a break in your routine that you can look forward to.   Perform all exercises about fifteen times, three times per day or as directed.  You should exercise both the operative leg and the other leg as well.  Exercises include:   Quad Sets - Tighten up the muscle on the front of the thigh (Quad) and hold for 5-10 seconds.   Straight Leg Raises - With your knee straight (if you were given a brace, keep it on), lift the leg to 60 degrees, hold for 3 seconds, and slowly lower the leg.  Perform this  exercise against resistance later as your leg gets stronger.  Leg Slides: Lying on your back, slowly slide your foot toward your buttocks, bending your knee up off the floor (only go as far as is comfortable). Then slowly slide your foot back down until your leg is flat on the floor again.  Angel Wings: Lying on your back spread your legs to the side as far apart as you can without causing discomfort.  Hamstring Strength:  Lying on your back, push your heel against the floor with your leg straight by tightening up the muscles of your buttocks.  Repeat, but this time bend your knee to a comfortable angle, and push your heel against the floor.  You may put a pillow under the heel to make it more comfortable if necessary.   A rehabilitation program following joint replacement surgery can speed recovery and prevent re-injury in the future due to weakened muscles. Contact your doctor or a physical therapist for more information on knee rehabilitation.    CONSTIPATION  Constipation is defined medically as fewer than three stools per week and severe constipation as less than one stool per week.  Even if you have a regular bowel pattern at home, your normal regimen is likely to be disrupted due to multiple reasons following surgery.  Combination of anesthesia, postoperative narcotics, change in appetite and fluid intake all can affect your bowels.   YOU MUST use at least one of the following options; they are listed in order of increasing strength to get the job done.  They are all available over the counter, and you may need to use some, POSSIBLY even all of these options:    Drink plenty of fluids (prune juice may be helpful) and high fiber foods Colace 100 mg by mouth twice a day  Senokot for constipation as directed and as needed Dulcolax (bisacodyl), take with full glass of water  Miralax (polyethylene glycol) once or twice a day as needed.  If you have tried all these things and are unable to have a  bowel movement in the first 3-4 days after surgery call either your surgeon or your primary doctor.    If you experience loose stools or diarrhea, hold the medications until you stool forms back up.  If your symptoms do not get better within 1 week or if they get worse, check with your doctor.  If you experience "the worst abdominal pain ever" or develop nausea or vomiting, please contact the office immediately for further recommendations for treatment.   ITCHING:  If you experience itching with your medications, try taking only a single pain pill, or even half a pain pill at a time.  You can also use Benadryl over the counter for itching or also to help with sleep.   TED HOSE STOCKINGS:  Use stockings on both legs until for at least 2 weeks or as directed by  physician office. They may be removed at night for sleeping.  MEDICATIONS:  See your medication summary on the "After Visit Summary" that nursing will review with you.  You may have some home medications which will be placed on hold until you complete the course of blood thinner medication.  It is important for you to complete the blood thinner medication as prescribed.  PRECAUTIONS:  If you experience chest pain or shortness of breath - call 911 immediately for transfer to the hospital emergency department.   If you develop a fever greater that 101 F, purulent drainage from wound, increased redness or drainage from wound, foul odor from the wound/dressing, or calf pain - CONTACT YOUR SURGEON.                                                   FOLLOW-UP APPOINTMENTS:  If you do not already have a post-op appointment, please call the office for an appointment to be seen by your surgeon.  Guidelines for how soon to be seen are listed in your "After Visit Summary", but are typically between 1-4 weeks after surgery.  OTHER INSTRUCTIONS:    POST-OPERATIVE OPIOID TAPER INSTRUCTIONS: It is important to wean off of your opioid medication as soon as  possible. If you do not need pain medication after your surgery it is ok to stop day one. Opioids include: Codeine, Hydrocodone(Norco, Vicodin), Oxycodone(Percocet, oxycontin) and hydromorphone amongst others.  Long term and even short term use of opiods can cause: Increased pain response Dependence Constipation Depression Respiratory depression And more.  Withdrawal symptoms can include Flu like symptoms Nausea, vomiting And more Techniques to manage these symptoms Hydrate well Eat regular healthy meals Stay active Use relaxation techniques(deep breathing, meditating, yoga) Do Not substitute Alcohol to help with tapering If you have been on opioids for less than two weeks and do not have pain than it is ok to stop all together.  Plan to wean off of opioids This plan should start within one week post op of your joint replacement. Maintain the same interval or time between taking each dose and first decrease the dose.  Cut the total daily intake of opioids by one tablet each day Next start to increase the time between doses. The last dose that should be eliminated is the evening dose.   MAKE SURE YOU:  Understand these instructions.  Get help right away if you are not doing well or get worse.    Thank you for letting us be a part of your medical care team.  It is a privilege we respect greatly.  We hope these instructions will help you stay on track for a fast and full recovery!

## 2022-10-24 NOTE — Interval H&P Note (Signed)
History and Physical Interval Note:  10/24/2022 9:54 AM  Carlos Peterson  has presented today for surgery, with the diagnosis of djd right hip.  The various methods of treatment have been discussed with the patient and family. After consideration of risks, benefits and other options for treatment, the patient has consented to  Procedure(s): TOTAL HIP ARTHROPLASTY (Right) as a surgical intervention.  The patient's history has been reviewed, patient examined, no change in status, stable for surgery.  I have reviewed the patient's chart and labs.  Questions were answered to the patient's satisfaction.     [name]

## 2022-10-24 NOTE — Transfer of Care (Signed)
Immediate Anesthesia Transfer of Care Note  Patient: Carlos Peterson  Procedure(s) Performed: TOTAL HIP ARTHROPLASTY (Right: Hip)  Patient Location: PACU  Anesthesia Type:Spinal  Level of Consciousness: awake, drowsy, and patient cooperative  Airway & Oxygen Therapy: Patient Spontanous Breathing and Patient connected to face mask oxygen  Post-op Assessment: Report given to RN and Post -op Vital signs reviewed and stable  Post vital signs: Reviewed and stable  Last Vitals:  Vitals Value Taken Time  BP 115/79 10/24/22 1326  Temp    Pulse 87 10/24/22 1328  Resp 18 10/24/22 1328  SpO2 98 % 10/24/22 1328  Vitals shown include unvalidated device data.  Last Pain:  Vitals:   10/24/22 0850  TempSrc:   PainSc: 0-No pain         Complications: No notable events documented.

## 2022-10-24 NOTE — Anesthesia Postprocedure Evaluation (Signed)
Anesthesia Post Note  Patient: Carlos Peterson  Procedure(s) Performed: TOTAL HIP ARTHROPLASTY (Right: Hip)     Patient location during evaluation: PACU Anesthesia Type: Spinal Level of consciousness: oriented and awake and alert Pain management: pain level controlled Vital Signs Assessment: post-procedure vital signs reviewed and stable Respiratory status: spontaneous breathing, respiratory function stable and nonlabored ventilation Cardiovascular status: blood pressure returned to baseline and stable Postop Assessment: no headache, no backache, no apparent nausea or vomiting, patient able to bend at knees and spinal receding Anesthetic complications: no   No notable events documented.  Last Vitals:  Vitals:   10/24/22 1430 10/24/22 1445  BP:  113/76  Pulse:  (!) 50  Resp:  16  Temp: 36.5 C   SpO2:  100%    Last Pain:  Vitals:   10/24/22 1430  TempSrc:   PainSc: 0-No pain    LLE Motor Response: Purposeful movement (10/24/22 1445) LLE Sensation: Decreased;Numbness (10/24/22 1445) RLE Motor Response: Purposeful movement (10/24/22 1445) RLE Sensation: Decreased;Numbness (10/24/22 1445) L Sensory Level: L4-Anterior knee, lower leg (10/24/22 1445) R Sensory Level: L4-Anterior knee, lower leg (10/24/22 1445)  [name]

## 2022-10-25 ENCOUNTER — Encounter (HOSPITAL_COMMUNITY): Payer: Self-pay | Admitting: Orthopedic Surgery

## 2022-10-25 DIAGNOSIS — M1611 Unilateral primary osteoarthritis, right hip: Secondary | ICD-10-CM | POA: Diagnosis not present

## 2022-10-25 LAB — CBC
HCT: 42.8 % (ref 39.0–52.0)
Hemoglobin: 13.8 g/dL (ref 13.0–17.0)
MCH: 27.8 pg (ref 26.0–34.0)
MCHC: 32.2 g/dL (ref 30.0–36.0)
MCV: 86.1 fL (ref 80.0–100.0)
Platelets: 319 10*3/uL (ref 150–400)
RBC: 4.97 MIL/uL (ref 4.22–5.81)
RDW: 13.8 % (ref 11.5–15.5)
WBC: 10.1 10*3/uL (ref 4.0–10.5)
nRBC: 0 % (ref 0.0–0.2)

## 2022-10-25 LAB — BASIC METABOLIC PANEL
Anion gap: 9 (ref 5–15)
BUN: 20 mg/dL (ref 8–23)
CO2: 24 mmol/L (ref 22–32)
Calcium: 8.8 mg/dL — ABNORMAL LOW (ref 8.9–10.3)
Chloride: 103 mmol/L (ref 98–111)
Creatinine, Ser: 0.99 mg/dL (ref 0.61–1.24)
GFR, Estimated: >60 mL/min (ref >60)
Glucose, Bld: 135 mg/dL — ABNORMAL HIGH (ref 70–99)
Potassium: 4.3 mmol/L (ref 3.5–5.1)
Sodium: 136 mmol/L (ref 135–145)

## 2022-10-25 MED ORDER — BACLOFEN 10 MG PO TABS
10.0000 mg | ORAL_TABLET | Freq: Three times a day (TID) | ORAL | Status: DC
  Administered 2022-10-25: 10 mg via ORAL
  Filled 2022-10-25: qty 1

## 2022-10-25 MED ORDER — ASPIRIN 325 MG PO TBEC: 325.0000 mg | DELAYED_RELEASE_TABLET | Freq: Two times a day (BID) | ORAL | 0 refills | Status: AC

## 2022-10-25 NOTE — Progress Notes (Signed)
     Subjective: 1 Day Post-Op s/p Procedure(s): TOTAL HIP ARTHROPLASTY   Patient is alert, oriented. States he had minimal sensation through ankle and feet yesterday but this has resolved. Feels ready to work with PT. Voiding well on own. Denies chest pain, SOB, Calf pain. A little nausea but no vomiting. No other complaints.    Objective:  PE: VITALS:   Vitals:   10/24/22 1900 10/24/22 2348 10/25/22 0150 10/25/22 0604  BP:  136/85 130/83 122/73  Pulse: 89 84 84 90  Resp:  17 17 17  $ Temp:  98.1 F (36.7 C) 98.1 F (36.7 C) 98.5 F (36.9 C)  TempSrc:  Oral Oral Oral  SpO2:  94% 96% 98%    Sensation intact distally Intact pulses distally Dorsiflexion/Plantar flexion intact Incision: dressing C/D/I  LABS  Results for orders placed or performed during the hospital encounter of 10/24/22 (from the past 24 hour(s))  CBC     Status: None   Collection Time: 10/25/22  3:27 AM  Result Value Ref Range   WBC 10.1 4.0 - 10.5 K/uL   RBC 4.97 4.22 - 5.81 MIL/uL   Hemoglobin 13.8 13.0 - 17.0 g/dL   HCT 42.8 39.0 - 52.0 %   MCV 86.1 80.0 - 100.0 fL   MCH 27.8 26.0 - 34.0 pg   MCHC 32.2 30.0 - 36.0 g/dL   RDW 13.8 11.5 - 15.5 %   Platelets 319 150 - 400 K/uL   nRBC 0.0 0.0 - 0.2 %  Basic metabolic panel     Status: Abnormal   Collection Time: 10/25/22  3:27 AM  Result Value Ref Range   Sodium 136 135 - 145 mmol/L   Potassium 4.3 3.5 - 5.1 mmol/L   Chloride 103 98 - 111 mmol/L   CO2 24 22 - 32 mmol/L   Glucose, Bld 135 (H) 70 - 99 mg/dL   BUN 20 8 - 23 mg/dL   Creatinine, Ser 0.99 0.61 - 1.24 mg/dL   Calcium 8.8 (L) 8.9 - 10.3 mg/dL   GFR, Estimated >60 >60 mL/min   Anion gap 9 5 - 15    DG Hip Port Unilat With Pelvis 1V Right  Result Date: 10/24/2022 CLINICAL DATA:  Postop EXAM: DG HIP (WITH OR WITHOUT PELVIS) 2V PORT RIGHT COMPARISON:  None Available. FINDINGS: Postop changes consistent with recent right total hip arthroplasty. Grossly anatomic alignment. Postop changes  lumbosacral fusion and discectomies. Osseous structures are otherwise unremarkable. IMPRESSION: Unremarkable appearance status post recent right total hip arthroplasty. Electronically Signed   By: Sammie Bench M.D.   On: 10/24/2022 15:03    Assessment/Plan: Principal Problem:   S/P hip replacement, right   1 Day Post-Op s/p Procedure(s): TOTAL HIP ARTHROPLASTY - Discussed incentive spirometry Weightbearing: WBAT RLE, up with therapy Insicional and dressing care: Reinforce dressings as needed VTE prophylaxis: Aspirin 326m BID x 30 days Pain control: continue with current regimen Follow - up plan: 2 weeks with Dr. LMardelle MatteDispo: pending progress with PT today, I expect him to likely be able to discharge home later this afternoon  Contact information:   BMerlene Pulling PA-C Weekdays 8-5  After hours and holidays please check [email or web address] for group call information for [facility]  BVentura Bruns2/21/2024, 8:17 AM

## 2022-10-25 NOTE — Progress Notes (Signed)
Discharge package printed and instructions given to patient. Patient verbalizes understanding. 

## 2022-10-25 NOTE — Plan of Care (Signed)
  Problem: Activity: Goal: Ability to avoid complications of mobility impairment will improve Outcome: Progressing Goal: Ability to tolerate increased activity will improve Outcome: Progressing   Problem: Pain Management: Goal: Pain level will decrease with appropriate interventions Outcome: Progressing   Problem: Safety: Goal: Ability to remain free from injury will improve Outcome: Progressing   

## 2022-10-25 NOTE — Discharge Summary (Signed)
Discharge Summary  Patient ID: Carlos Peterson MRN: [id number] DOB/AGE: March 14, 1961 62 y.o.  Admit date: 10/24/2022 Discharge date: 10/25/2022  Admission Diagnoses:  S/P hip replacement, right  Discharge Diagnoses:  Principal Problem:   S/P hip replacement, right   Past Medical History:  Diagnosis Date   Arthritis    BPH (benign prostatic hyperplasia)    Chronic pain    Depression    Erectile dysfunction    Hypertension    Myopia    Peripheral polyneuropathy    Pre-diabetes    Spinal stenosis     Surgeries: Procedure(s): TOTAL HIP ARTHROPLASTY on 10/24/2022   Consultants (if any):   Discharged Condition: Improved  Hospital Course: Marcum Arkwright is an 62 y.o. male who was admitted 10/24/2022 with a diagnosis of S/P hip replacement, right and went to the operating room on 10/24/2022 and underwent the above named procedures.    He was given perioperative antibiotics:  Anti-infectives (From admission, onward)    Start     Dose/Rate Route Frequency Ordered Stop   10/24/22 0845  ceFAZolin (ANCEF) IVPB 2g/100 mL premix        2 g 200 mL/hr over 30 Minutes Intravenous On call to O.R. 10/24/22 0830 10/24/22 1105     .  He was given sequential compression devices, early ambulation, and aspirin for DVT prophylaxis.  He benefited maximally from the hospital stay and there were no complications.    Recent vital signs:  Vitals:   10/25/22 0604 10/25/22 0950  BP: 122/73 (!) 136/93  Pulse: 90 91  Resp: 17 18  Temp: 98.5 F (36.9 C) 98.4 F (36.9 C)  SpO2: 98% 96%    Recent laboratory studies:  Lab Results  Component Value Date   HGB 13.8 10/25/2022   HGB 14.5 10/13/2022   HGB 13.7 04/23/2021   Lab Results  Component Value Date   WBC 10.1 10/25/2022   PLT 319 10/25/2022   No results found for: "INR" Lab Results  Component Value Date   NA 136 10/25/2022   K 4.3 10/25/2022   CL 103 10/25/2022   CO2 24 10/25/2022   BUN 20 10/25/2022   CREATININE 0.99 10/25/2022    GLUCOSE 135 (H) 10/25/2022    Discharge Medications:   Allergies as of 10/25/2022   No Known Allergies      Medication List     TAKE these medications    aspirin EC 325 MG tablet Take 1 tablet (325 mg total) by mouth 2 (two) times daily.   methocarbamol 500 MG tablet Commonly known as: ROBAXIN Take 500 mg by mouth every 8 (eight) hours as needed for muscle spasms.   Oxycodone HCl 10 MG Tabs Take 10 mg by mouth every 4 (four) hours as needed (severe pain).        Diagnostic Studies: DG Hip Port Unilat With Pelvis 1V Right  Result Date: 10/24/2022 CLINICAL DATA:  Postop EXAM: DG HIP (WITH OR WITHOUT PELVIS) 2V PORT RIGHT COMPARISON:  None Available. FINDINGS: Postop changes consistent with recent right total hip arthroplasty. Grossly anatomic alignment. Postop changes lumbosacral fusion and discectomies. Osseous structures are otherwise unremarkable. IMPRESSION: Unremarkable appearance status post recent right total hip arthroplasty. Electronically Signed   By: Sammie Bench M.D.   On: 10/24/2022 15:03   CT CHEST LUNG CA SCREEN LOW DOSE W/O CM  Result Date: 10/12/2022 CLINICAL DATA:  55 pack-year smoking history/quit 12 years ago EXAM: CT CHEST WITHOUT CONTRAST LOW-DOSE FOR LUNG CANCER SCREENING TECHNIQUE: Multidetector CT imaging  of the chest was performed following the standard protocol without IV contrast. RADIATION DOSE REDUCTION: This exam was performed according to the departmental dose-optimization program which includes automated exposure control, adjustment of the mA and/or kV according to patient size and/or use of iterative reconstruction technique. COMPARISON:  None Available. FINDINGS: Cardiovascular: Normal aortic caliber. Normal heart size, without pericardial effusion. Isolated enlargement of the right pulmonary artery at 3.2 cm on 28/2. Mediastinum/Nodes: No mediastinal or hilar adenopathy, given limitations of unenhanced CT. Lungs/Pleura: No pleural fluid. Mild  centrilobular emphysema. Minimal posterior right upper lobe and right middle lobe scarring. No suspicious pulmonary nodule or mass. Upper Abdomen: Normal imaged portions of the liver, spleen, stomach, pancreas, adrenal glands, kidneys, gallbladder. Musculoskeletal: No acute osseous abnormality. IMPRESSION: 1. Lung-RADS 1, negative. Continue annual screening with low-dose chest CT without contrast in 12 months. 2.  Emphysema (ICD10-J43.9). 3. Isolated right pulmonary artery enlargement is nonspecific but can be seen with pulmonary arterial hypertension. This could be correlated with echocardiography. Electronically Signed   By: Abigail Miyamoto M.D.   On: 10/12/2022 09:15    Disposition: Discharge disposition: 01-Home or Self Care          Follow-up Information     Marchia Bond, MD. Schedule an appointment as soon as possible for a visit in 2 week(s).   Specialty: Orthopedic Surgery Contact information: [place] [address] [phone]         [facility] [place]. Go on 10/30/2022.   Specialty: Orthopedic Surgery Why: Appt on 2/26 3:15 PM Contact information: [facility] [place] [id number] [phone]                  Signed: Jola Baptist 10/25/2022, 10:51 AM

## 2022-10-25 NOTE — TOC Transition Note (Signed)
Transition of Care [facility]) - CM/SW Discharge Note   Patient Details  Name: Carlos Peterson MRN: [id number] Date of Birth: 03-01-1961  Transition of Care [facility]) CM/SW Contact:  Lennart Pall, LCSW Phone Number: 10/25/2022, 9:53 AM   Clinical Narrative:    Met with pt and confirming he already has needed DME at home.  Plan for OPPT via [place].  No further TOC needs.   Final next level of care: OP Rehab Barriers to Discharge: No Barriers Identified   Patient Goals and CMS Choice      Discharge Placement                         Discharge Plan and Services Additional resources added to the After Visit Summary for                  DME Arranged: N/A DME Agency: NA                  Social Determinants of Health (SDOH) Interventions SDOH Screenings   Tobacco Use: Medium Risk (10/24/2022)     Readmission Risk Interventions     No data to display

## 2022-10-25 NOTE — Evaluation (Signed)
Physical Therapy Evaluation Patient Details Name: Carlos Peterson MRN: [id number] DOB: 03-29-1961 Today's Date: 10/25/2022  History of Present Illness  62 yo male s/p posterior R THA. PMH: HTN, spinal stenosis  Clinical Impression  Pt doing well, motivated to work with PT and d/c home today.  See below for mobility. Pt is ready to d/c home with family assist as needed from PT standpoint.        Recommendations for follow up therapy are one component of a multi-disciplinary discharge planning process, led by the attending physician.  Recommendations may be updated based on patient status, additional functional criteria and insurance authorization.  Follow Up Recommendations Follow physician's recommendations for discharge plan and follow up therapies      Assistance Recommended at Discharge Intermittent Supervision/Assistance  Patient can return home with the following  Assistance with cooking/housework;Assist for transportation;Help with stairs or ramp for entrance    Equipment Recommendations None recommended by PT  Recommendations for Other Services       Functional Status Assessment       Precautions / Restrictions Precautions Precautions: Fall Restrictions Weight Bearing Restrictions: No Other Position/Activity Restrictions: WBAT      Mobility  Bed Mobility               General bed mobility comments: in recliner on arrival    Transfers Overall transfer level: Needs assistance Equipment used: Rolling walker (2 wheels) Transfers: Sit to/from Stand Sit to Stand: Supervision           General transfer comment: cues for hand placement and RLE position    Ambulation/Gait Ambulation/Gait assistance: Supervision Gait Distance (Feet): 120 Feet Assistive device: Rolling walker (2 wheels) Gait Pattern/deviations: Step-to pattern, Step-through pattern       General Gait Details: initial cues for sequence, good stability with RW, no LOB; progression to step  through end of distance  Stairs            Wheelchair Mobility    Modified Rankin (Stroke Patients Only)       Balance                                             Pertinent Vitals/Pain Pain Assessment Pain Assessment: 0-10 Pain Score: 5  Pain Location: R hip Pain Descriptors / Indicators: Discomfort, Aching Pain Intervention(s): Limited activity within patient's tolerance, Monitored during session, Premedicated before session, Repositioned    Home Living Family/patient expects to be discharged to:: Private residence Living Arrangements: Spouse/significant other Available Help at Discharge: Family Type of Home: House Home Access: Level entry       Home Layout: One level Home Equipment: Conservation officer, nature (2 wheels)      Prior Function Prior Level of Function : Independent/Modified Independent                     Hand Dominance        Extremity/Trunk Assessment   Upper Extremity Assessment Upper Extremity Assessment: Overall WFL for tasks assessed    Lower Extremity Assessment Lower Extremity Assessment: RLE deficits/detail RLE Deficits / Details: ankle WFL, knee and hip grossly 3/5 within limits of post op pain       Communication      Cognition Arousal/Alertness: Awake/alert Behavior During Therapy: WFL for tasks assessed/performed Overall Cognitive Status: Within Functional Limits for tasks assessed  General Comments      Exercises Total Joint Exercises Ankle Circles/Pumps: AROM, Both, 10 reps Quad Sets: AROM, Both, 5 reps Heel Slides: AROM, Right, 10 reps   Assessment/Plan    PT Assessment All further PT needs can be met in the next venue of care  PT Problem List         PT Treatment Interventions      PT Goals (Current goals can be found in the Care Plan section)  Acute Rehab PT Goals PT Goal Formulation: All assessment and education complete, DC  therapy    Frequency       Co-evaluation               AM-PAC PT "6 Clicks" Mobility  Outcome Measure Help needed turning from your back to your side while in a flat bed without using bedrails?: None Help needed moving from lying on your back to sitting on the side of a flat bed without using bedrails?: None Help needed moving to and from a bed to a chair (including a wheelchair)?: None Help needed standing up from a chair using your arms (e.g., wheelchair or bedside chair)?: None Help needed to walk in hospital room?: None Help needed climbing 3-5 steps with a railing? : A Little 6 Click Score: 23    End of Session Equipment Utilized During Treatment: Gait belt Activity Tolerance: Patient tolerated treatment well Patient left: in chair;with call bell/phone within reach;with chair alarm set   PT Visit Diagnosis: Difficulty in walking, not elsewhere classified (R26.2)    Time: [id number] PT Time Calculation (min) (ACUTE ONLY): 20 min   Charges:   PT Evaluation $PT Eval Low Complexity: [place], PT  Acute Rehab Dept [facility]) [phone]  [facility] Weekend Pager (Saturday/Sunday only)  [phone]  10/25/2022   [facility] 10/25/2022, 12:20 PM

## 2022-11-16 ENCOUNTER — Ambulatory Visit (HOSPITAL_COMMUNITY): Payer: Medicare HMO | Attending: Adult Health

## 2022-11-16 DIAGNOSIS — I272 Pulmonary hypertension, unspecified: Secondary | ICD-10-CM | POA: Insufficient documentation

## 2022-11-17 LAB — ECHOCARDIOGRAM COMPLETE
Area-P 1/2: 5.27 cm2
S' Lateral: 2.8 cm

## 2023-03-27 ENCOUNTER — Ambulatory Visit: Payer: Medicare HMO | Admitting: Orthopaedic Surgery

## 2023-10-12 ENCOUNTER — Other Ambulatory Visit: Payer: Self-pay | Admitting: Geriatric Medicine

## 2023-10-12 DIAGNOSIS — Z87891 Personal history of nicotine dependence: Secondary | ICD-10-CM

## 2023-10-12 DIAGNOSIS — Z122 Encounter for screening for malignant neoplasm of respiratory organs: Secondary | ICD-10-CM

## 2023-11-06 ENCOUNTER — Other Ambulatory Visit: Payer: Medicare HMO

## 2024-01-13 IMAGING — CT CT L SPINE W/ CM
1 of 7 series · 6 of 14 positions shown, 8 images · non-contrast
Comparison: MRI lumbar spine dated July 04, 2021.

CLINICAL DATA: Chronic right greater than left low back and leg
pain. History of prior surgery, most recently in October 2019.
TECHNIQUE: Contiguous axial images were obtained through the Lumbar spine after
the intrathecal infusion of contrast. Coronal and sagittal
reconstructions were obtained of the axial image sets.

[Series 3: l spine soft · axial · 0.31mm/px · z∈[-765,-585]mm · 6 of 128 slices shown, 8 images]
[im 19/128  soft-tissue]
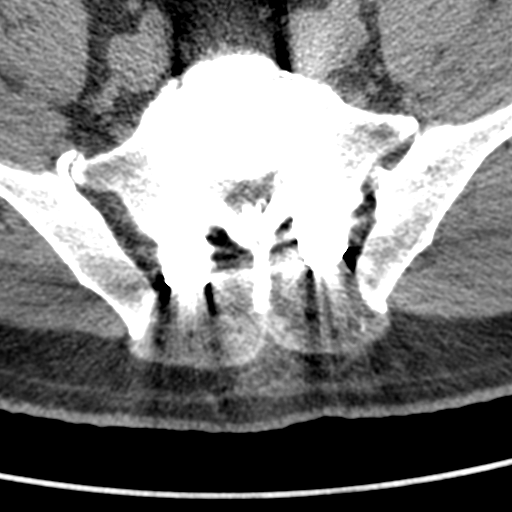
[im 19/128  bone]
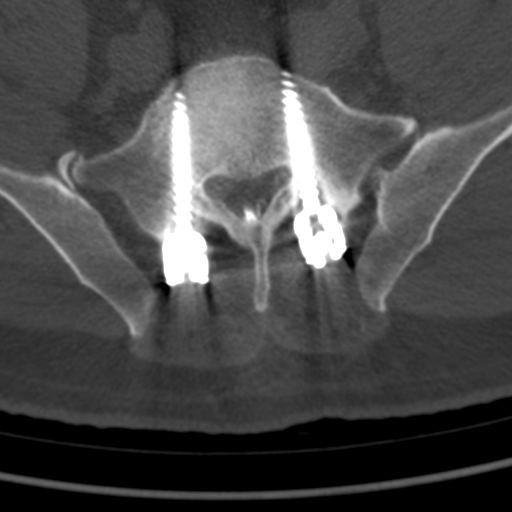
[im 37/128  bone]
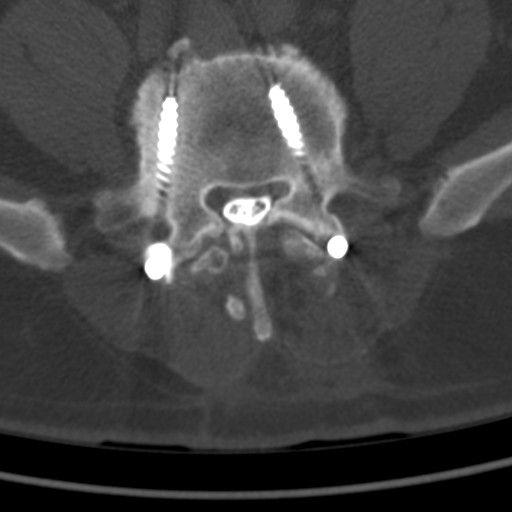
[im 55/128  bone]
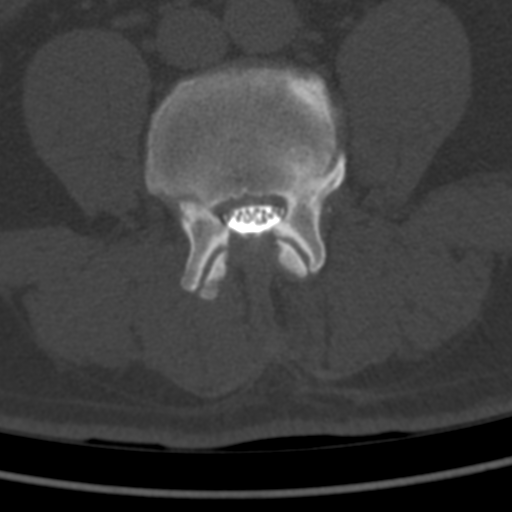
[im 73/128  bone]
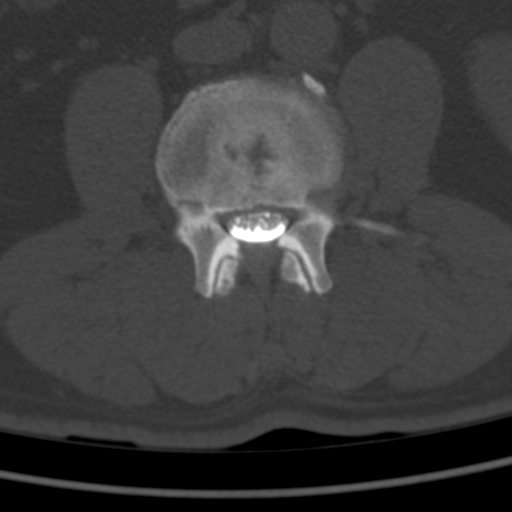
[im 91/128  soft-tissue]
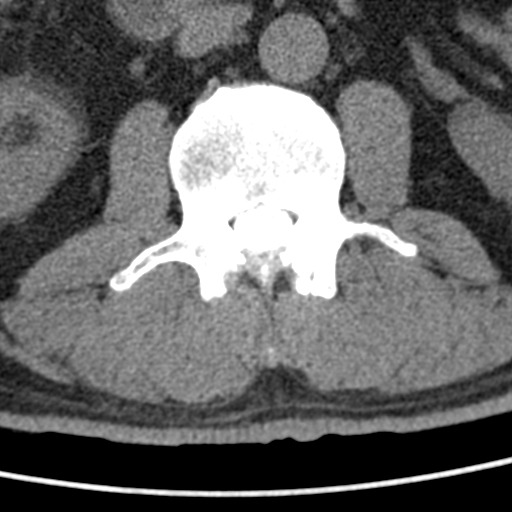
[im 91/128  bone]
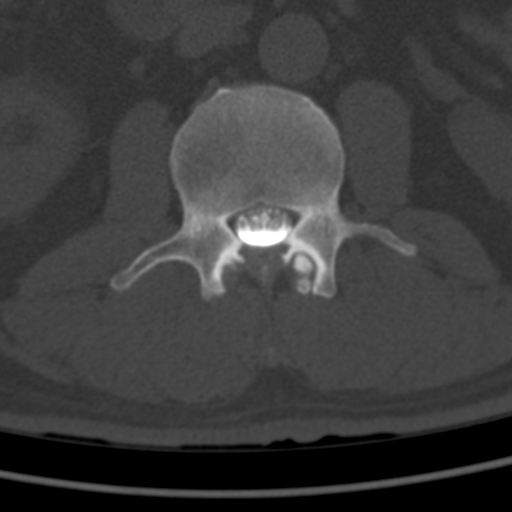
[im 109/128  bone]
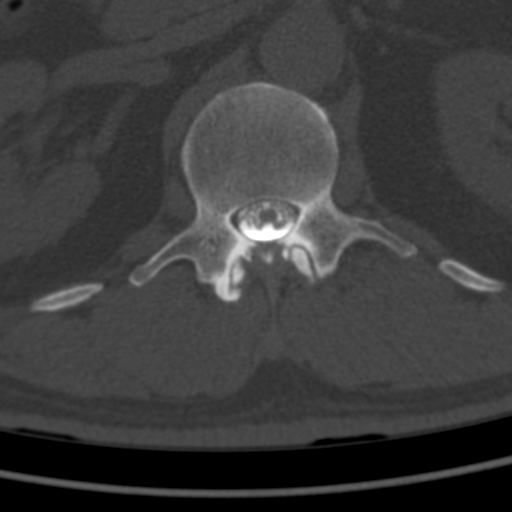

[6 of 14 positions shown; findings below may reference images not displayed]

EXAM:
LUMBAR MYELOGRAM

CT lumbar myelogram

FLUOROSCOPY TIME:  Radiation Exposure Index (as provided by the
fluoroscopic device): 17.4 mGy Kerma

PROCEDURE:
After thorough discussion of risks and benefits of the procedure
including bleeding, infection, injury to nerves, blood vessels,
adjacent structures as well as headache and CSF leak, written and
oral informed consent was obtained. Consent was obtained by Dr.
Flame Phu. Time out form was completed.

Patient was positioned prone on the fluoroscopy table. Local
anesthesia was provided with 1% lidocaine without epinephrine after
prepped and draped in the usual sterile fashion. Puncture was
performed at L1-L2 using a 3 1/2 inch 22-gauge spinal needle via
right interlaminar approach. Using a single pass through the dura,
the needle was placed within the thecal sac, with return of clear
CSF. 15 mL of Isovue Y-OHH was injected into the thecal sac, with
normal opacification of the nerve roots and cauda equina consistent
with free flow within the subarachnoid space.

I personally supervised the lumbar puncture and administration of
the intrathecal contrast. I also personally supervised acquisition
of the myelogram images.
FINDINGS: LUMBAR MYELOGRAM FINDINGS:

Unchanged straightening of the normal lumbar lordosis with trace
stepwise retrolisthesis from L2-L3 through L5-S1. No dynamic
instability.

Prior L5-S1 PLIF. Small ventral extradural defects from L2-L3
through L4-L5 that increase with standing. Mild spinal canal
stenosis from L2-L3 through L4-L5. Underfilling of the bilateral L5
and right S1 nerve roots.

CT LUMBAR MYELOGRAM FINDINGS:

Segmentation: Standard.

Alignment: Unchanged trace stepwise retrolisthesis from L2-L3
through L5-S1.

Vertebrae: Prior L5-S1 PLIF. Lucency around the right L5 and left S1
screws. No bridging bone. No acute fracture or other focal
pathologic process. Large Schmorl's node involving the L3 superior
endplate.

Conus medullaris and cauda equina: Conus extends to the L1 level.
Conus and cauda equina appear normal.

Paraspinal and other soft tissues: Negative.

Disc levels:

T12-L1: Negative disc. Mild-to-moderate right and mild left facet
arthropathy. No stenosis.

L1-L2: Negative disc. Mild bilateral facet arthropathy. No stenosis.

L2-L3: Unchanged mild disc bulging and left foraminal endplate
spurring. Unchanged mild bilateral facet arthropathy. Unchanged mild
spinal canal stenosis. No neuroforaminal stenosis.

L3-L4: Unchanged mild disc bulging with superimposed left foraminal
disc osteophyte complex. Unchanged mild bilateral facet arthropathy.
Unchanged mild spinal canal stenosis and moderate left
neuroforaminal stenosis.

L4-L5: Prior posterior decompression. Unchanged moderate disc
bulging and mild bilateral facet arthropathy. Unchanged mild spinal
canal stenosis with moderate left and mild right lateral recess
stenosis. Unchanged moderate right and mild left neuroforaminal
stenosis.

L5-S1: Prior posterior decompression and PLIF with pseudoarthrosis.
Bulky right subarticular disc osteophyte complex encroaching on the
descending right S1 nerve root. Unchanged moderate right lateral
recess stenosis and moderate left neuroforaminal stenosis. No spinal
canal or right neuroforaminal stenosis.
IMPRESSION: 1. Prior L5-S1 PLIF with pseudoarthrosis and screw loosening. Bulky
right subarticular disc osteophyte complex encroaching on the
descending right S1 nerve root.
2. Unchanged mild spinal canal stenosis from L2-L3 through L4-L5.
3. Unchanged moderate left neuroforaminal stenosis from L3-L4
through L5-S1.

## 2024-04-30 NOTE — Progress Notes (Unsigned)
 Psychiatric Initial Adult Assessment   Patient Identification: Carlos Peterson MRN:  [id number] Date of Evaluation:  05/12/2024 Referral Source: PCP Chief Complaint:   Chief Complaint  Patient presents with   Establish Care   Chronic insomnia   Visit Diagnosis:    ICD-10-CM   1. Primary insomnia  F51.01 QUEtiapine  (SEROQUEL ) 50 MG tablet    Polysomnography 4 or more parameters    Polysomnography 4 or more parameters    traZODone  (DESYREL ) 150 MG tablet       Assessment:  Carlos Peterson is a 63 y.o. male with a history of insomnia on trazodone  who presents in person to [facility] for initial evaluation on 05/12/2024.   At initial evaluation patient reports trouble staying asleep that has chronically progressed over the past 10 years.  He can go to bed at 930 to 10 PM but wakes up in the middle of night 2-3 times every night, does not feel refreshed has low energy in the morning.  He is not feeling depressed, anxious, there were no symptoms of mania or psychosis.  He is not drinking alcohol, smoking cigarettes or using any other substances.  He has been eating well.  He has chronic back pain, is currently on oxycodone  50 mg daily.  He has tried mirtazapine, melatonin and is currently on trazodone  and Lexapro, currently taking 250 milligram of trazodone  nightly.  Patient inquired about Ambien  however I strongly discouraged after explaining the risk benefits and side effects, patient was amenable to the plan.  Since the patient has already tried few medications that works on similar receptors of trazodone , we discussed about starting Seroquel  50 mg nightly for sleep in addition to trazodone  150 mg.  We also discussed sleep training that includes getting up from the bed, trying to read a book or listen to podcast when he wakes up in the middle of the night, patient was receptive.  A referral for sleep study was also sent to rule out sleep apnea that may be contributing to  his disturbed sleep.  We plan to follow-up with him in 4 weeks.  A number of assessments were performed during the evaluation today including  PHQ-9 which they scored a 6 on, GAD-7 which they scored a 0 on, and [place] suicide severity screening which showed low risk.    Risk Assessment: A suicide and violence risk assessment was performed as part of this evaluation. There patient is deemed to be at chronic elevated risk for self-harm/suicide given the following factors: N/A. These risk factors are mitigated by the following factors: lack of active SI/HI, no known access to weapons or firearms, no history of previous suicide attempts, no history of violence, and motivation for treatment. The patient is deemed to be at chronic elevated risk for violence given the following factors: N/A. These risk factors are mitigated by the following factors: no known history of violence towards others, no known violence towards others in the last 6 months, no known history of threats of harm towards others, no known homicidal ideation in the last 6 months, no command hallucinations to harm others in the last 6 months, no active symptoms of psychosis, no active symptoms of mania, and N/A. There is no acute risk for suicide or violence at this time. The patient was educated about relevant modifiable risk factors including following recommendations for treatment of psychiatric illness and abstaining from substance abuse.  While future psychiatric events cannot be accurately predicted, the patient does not currently  require  acute inpatient psychiatric care and does not currently meet [place]  involuntary commitment criteria.  Patient was given contact information for crisis resources, behavioral health clinic and was instructed to call 911 for emergencies.    Plan: # Chronic insomnia Past medication trials: Trazodone , mirtazapine, escitalopram Status of problem: New to the Clinical research associate, chronic Interventions: --  Continue trazodone  150 mg nightly -- Start Seroquel  50 mg nightly -- Provided sleep training that includes reading a book, listening to podcast when the patient wakes up in the middle of night --Slowly taper Lexapro to half a pill of 10 mg for 1 week and discontinue after that. -- Ordered sleep study to rule out sleep apnea -- Can discuss about CBT-I in the next visit, if minimal benefit with the medications and patient amenable  History of Present Illness:   Mr. Christoffersen is a 63 year old male with no past significant psychiatric history apart from chronic insomnia that presents to the clinic to establish care for sleep difficulties.  Patient reports that he has been having issues with sleep over the past 10 years that have been progressing slowly to the point that I am not able to stay asleep, wake up 2-3 times each night and have troubles going back to sleep .  He reports that he does not feel refreshed in the morning, has low energy for which he is drinking 5-hour energy shots and exercising to work throughout the day.  Reports that he sleeps around 930 to 10:30 PM and wakes up around 4:30 AM to go to work.  He reported that he was started on trazodone  50 mg that was slowly increased to 150 mg nightly, he currently takes about 250 mg every night with minimal benefit.  He also reported that he was started on Remeron 15 mg and melatonin which was later stopped as it did not help.  He is currently on Lexapro in addition to trazodone  for sleep but he reports minimal benefit.  He reported that I have not had a good sleep in the past 10 years .  He denied feeling depressed, anxious, denied any symptoms of PTSD, mania.  He denied any active or passive SI/HI/AVH.  He reported good appetite.  Denied having any access to guns or any lethal means.  Denied any history of trauma.  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  He is currently  on 50 mg of oxycodone  daily.  He denied getting stiffness or pain during the sleep at night.  He denied using any substances including alcohol, cigarettes or marijuana.  He reported that trying to stay healthy .  Patient inquired about Ambien , risk benefits and side effects were discussed, patient amenable to not starting it.  We discussed about starting Seroquel  at 50 mg nightly, R/B/SE were discussed.  Recommended continue taking trazodone  150 mg nightly in addition and discontinuing mirtazapine, tapering Lexapro to half a pill of 10 mg for 1 week and discontinuing further.  Patient amenable to the plan.  Plan to follow-up in 4 weeks.  Associated Signs/Symptoms: Depression Symptoms:  disturbed sleep, (Hypo) Manic Symptoms:  None Anxiety Symptoms:  None Psychotic Symptoms:  None PTSD Symptoms: None  Past Psychiatric History:  Past psychiatric diagnoses: Insomnia Psychiatric hospitalizations: None Past suicide attempts: Denies Hx of self harm: Denies Hx of violence towards others: Denies Prior psychiatric providers: None Prior therapy: None Access to firearms: Denies  Prior medication trials: Trazodone   Substance use: Denies  Past Medical History:  Past Medical History:  Diagnosis Date   Arthritis    BPH (benign prostatic hyperplasia)    Chronic pain    Depression    Erectile dysfunction    Hypertension    Myopia    Peripheral polyneuropathy    Pre-diabetes    Spinal stenosis     Past Surgical History:  Procedure Laterality Date   BACK SURGERY     x4   HERNIA REPAIR     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   Procedure: TOTAL HIP ARTHROPLASTY;  Surgeon: Josefina Chew, MD;  Location: [facility];  Service: Orthopedics;  Laterality: Right;   TRANSURETHRAL RESECTION OF PROSTATE N/A 04/22/2021   Procedure: TRANSURETHRAL RESECTION OF THE PROSTATE (TURP);  Surgeon: Selma Donnice SAUNDERS, MD;  Location: [facility];  Service: Urology;  Laterality: N/A;   WISDOM TOOTH EXTRACTION       Family Psychiatric History: Nothing significant  Family History:  Family History  Problem Relation Age of Onset   Cancer Mother    Cancer Father     Social History:   Social History   Socioeconomic History   Marital status: Married    Spouse name: Not on file   Number of children: Not on file   Years of education: Not on file   Highest education level: Not on file  Occupational History   Not on file  Tobacco Use   Smoking status: Former    Current packs/day: 0.00    Average packs/day: 1 pack/day for 30.0 years (30.0 ttl pk-yrs)    Types: Cigarettes    Start date: 99    Quit date: 2011    Years since quitting: 14.6   Smokeless tobacco: Never  Vaping Use   Vaping status: Never Used  Substance and Sexual Activity   Alcohol use: Never   Drug use: Never   Sexual activity: Yes  Other Topics Concern   Not on file  Social History Narrative   Not on file   Social Drivers of Health   Financial Resource Strain: Not on file  Food Insecurity: Not on file  Transportation Needs: Not on file  Physical Activity: Not on file  Stress: Not on file  Social Connections: Not on file    Additional Social History: Patient wakes up early in the morning at 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, drives to [employer]  Allergies:  No Known Allergies  Metabolic Disorder Labs: Lab Results  Component Value Date   HGBA1C 5.7 (H) 10/13/2022   MPG 116.89 10/13/2022   MPG 122.63 04/15/2021   No results found for: PROLACTIN No results found for: CHOL, TRIG, HDL, CHOLHDL, VLDL, LDLCALC No results found for: TSH  Therapeutic Level Labs: No results found for: LITHIUM No results found for: CBMZ No results found for: VALPROATE  Current Medications: Current Outpatient Medications  Medication Sig Dispense Refill   QUEtiapine  (SEROQUEL ) 50 MG tablet Take 1 tablet (50 mg total) by mouth at bedtime. 30 tablet 0   aspirin  EC 325 MG tablet Take 1 tablet (325 mg total) by mouth 2 (two) times  daily. 60 tablet 0   methocarbamol  (ROBAXIN ) 500 MG tablet Take 500 mg by mouth every 8 (eight) hours as needed for muscle spasms.     Oxycodone  HCl 10 MG TABS Take 10 mg by mouth every 4 (four) hours as needed (severe pain).     traZODone  (DESYREL ) 150 MG tablet Take 1 tablet (150 mg total) by mouth at bedtime. 30 tablet 0   No  current facility-administered medications for this visit.    Musculoskeletal: Strength & Muscle Tone: within normal limits Gait & Station: normal Patient leans: N/A  Psychiatric Specialty Exam:  Psychiatric Specialty Exam: Blood pressure (!) 150/100, pulse 74, height 6' (1.829 m), weight 198 lb 6.4 oz (90 kg).Body mass index is 26.91 kg/m. Review of Systems  Constitutional:  Negative for activity change, appetite change, chills, diaphoresis and fatigue.  HENT:  Negative for congestion, dental problem, drooling, ear discharge and ear pain.   Eyes:  Negative for pain, discharge and itching.  Respiratory:  Negative for apnea, cough, choking and chest tightness.   Cardiovascular:  Negative for chest pain, palpitations and leg swelling.  Gastrointestinal:  Negative for abdominal distention, abdominal pain, constipation, diarrhea and nausea.  Endocrine: Negative for cold intolerance and heat intolerance.  Genitourinary:  Negative for difficulty urinating, dysuria, flank pain, frequency, hematuria and urgency.  Musculoskeletal:  Negative for arthralgias, back pain, gait problem, joint swelling, myalgias and neck pain.  Skin:  Negative for color change and pallor.  Allergic/Immunologic: Negative for environmental allergies and food allergies.  Neurological:  Negative for dizziness, seizures, syncope, facial asymmetry, speech difficulty, light-headedness, numbness and headaches.  Psychiatric/Behavioral:  Positive for sleep disturbance. Negative for agitation, behavioral problems, confusion, decreased concentration, dysphoric mood, hallucinations, self-injury and suicidal  ideas. The patient is not nervous/anxious and is not hyperactive.     General Appearance: Casual and Fairly Groomed  Eye Contact:  Good  Speech:  Clear and Coherent  Volume:  Normal  Mood:  Euthymic  Affect:  Congruent  Thought Content: Logical   Suicidal Thoughts:  No  Homicidal Thoughts:  No  Thought Process:  Goal Directed  Orientation:  Full (Time, Place, and Person)    Memory: Immediate;   Fair Recent;   Fair Remote;   Fair  Judgment:  Intact  Insight:  Fair  Concentration:  Concentration: Good and Attention Span: Good  Recall:  not formally assessed   Fund of Knowledge: Fair  Language: Good  Psychomotor Activity:  Normal  Akathisia:  No  AIMS (if indicated): not done  Assets:  Communication Skills Desire for Improvement Housing Leisure Time Resilience Talents/Skills Vocational/Educational  ADL's:  Intact  Cognition: WNL  Sleep:  Fair    Screenings: PHQ2-9    Flowsheet Row [facility]  PHQ-2 Total Score 0  PHQ-9 Total Score 6   Flowsheet Row Admission (Discharged) from 10/24/2022 in [place] [facility] Pre-Admission Testing 60 from 10/13/2022 in [facility] Admission (Discharged) from 04/22/2021 in [place] [facility] UROLOGY  C-SSRS RISK CATEGORY No Risk No Risk No Risk     Collaboration of Care: Other PCP notes, Dr. Carvin  Patient/Guardian was advised Release of Information must be obtained prior to any record release in order to collaborate their care with an outside provider. Patient/Guardian was advised if they have not already done so to contact the registration department to sign all necessary forms in order for us  to release information regarding their care.   Consent: Patient/Guardian gives verbal consent for treatment and assignment of benefits for services provided during this visit. Patient/Guardian  expressed understanding and agreed to proceed.   Albirta Rhinehart, MD 9/8/202510:24 AM

## 2024-05-12 ENCOUNTER — Ambulatory Visit (HOSPITAL_BASED_OUTPATIENT_CLINIC_OR_DEPARTMENT_OTHER): Payer: Self-pay

## 2024-05-12 ENCOUNTER — Encounter (HOSPITAL_COMMUNITY): Payer: Self-pay

## 2024-05-12 VITALS — BP 150/100 | HR 74 | Ht 72.0 in | Wt 198.4 lb

## 2024-05-12 DIAGNOSIS — F5101 Primary insomnia: Secondary | ICD-10-CM

## 2024-05-12 DIAGNOSIS — G4700 Insomnia, unspecified: Secondary | ICD-10-CM | POA: Insufficient documentation

## 2024-05-12 MED ORDER — QUETIAPINE FUMARATE 50 MG PO TABS: 50.0000 mg | ORAL_TABLET | Freq: Every day | ORAL | 0 refills | Status: DC

## 2024-05-12 MED ORDER — TRAZODONE HCL 150 MG PO TABS: 150.0000 mg | ORAL_TABLET | Freq: Every day | ORAL | 0 refills | Status: DC

## 2024-05-12 NOTE — Addendum Note (Signed)
 Addended by: CARVIN CROCK on: 05/12/2024 12:02 PM   Modules accepted: Level of Service

## 2024-05-26 NOTE — Progress Notes (Signed)
 BH MD/PA/NP OP Progress Note  06/09/2024 3:23 PM Carlos Peterson  MRN:  [id number]  Visit Diagnosis:    ICD-10-CM   1. Primary insomnia  F51.01 QUEtiapine  (SEROQUEL ) 50 MG tablet    traZODone  (DESYREL ) 150 MG tablet      Assessment:  Carlos Peterson is a 63 y.o. male with a history of insomnia on trazodone  who presents in person to [facility] at [facility] for  follow-up. Carlos Peterson presents for follow-up evaluation. Today, 06/09/24, patient reports improvement in his sleep, he is able to sleep consistently for at least 8 hours on the days he works, has no nightmares or flashbacks.  He has tolerated all the medications without any side effects.  He does report low energy on few days, discussed about decreasing trazodone  to 100 mg, he did not want to make any medication changes currently.  Sleep hygiene is still an issue, encouraged to decrease screen time, maintain a consistent sleep schedule, patient receptive.  He has not been using any substances including alcohol or cigarettes, is not actively or passively suicidal or homicidal.  He is not depressed or anxious and has been eating well.  Since the medications have been beneficial, plan to continue the same medication management.  Encouraged sleep study and CBT-I, patient currently not amenable.  We will have him back in the clinic in 2 months.  If Seroquel  continues to have good therapeutic benefit, plan to taper down trazodone  and have him only on Seroquel  for sleep.  Risk Assessment: An assessment of suicide and violence risk factors was performed as part of this evaluation and is not significantly changed from the last visit. While future psychiatric events cannot be accurately predicted, the patient does not currently require acute inpatient psychiatric care and does not currently meet [place]  involuntary commitment criteria. Patient was given contact information for crisis resources, behavioral health clinic and was  instructed to call 911 for emergencies.     # Chronic insomnia Past medication trials: Trazodone , mirtazapine, escitalopram Status of problem: New to the Clinical research associate, chronic Interventions: -- Continue trazodone  150 mg nightly -- Continue Seroquel  50 mg nightly -- Provided sleep education, reducing screen time before bed -- Ordered sleep study to rule out sleep apnea -- Patient not amenable for CBT-I  Chief Complaint:  Chief Complaint  Patient presents with   Medication Refill   Follow-up   Insomnia   HPI:  Carlos Peterson is a 63 year old male with no past significant psychiatric history apart from chronic insomnia that presents to the clinic for psychiatric follow-up for insomnia.  Today, patient reported his mood is good .  He denied any active or passive SI/HI/AVH.  He reported good appetite and reported his sleep as better .  Stated that he sleeps at 8:30 PM, wakes up at 5 AM every day, is asleep throughout the night, denied any nightmares flashbacks.  Reported that he sometimes shouts and sleep which his wife has reported.  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  He reported that he sometimes watches television for 2 hours prior to bedtime on the days he is off the next day, discussed sleep hygiene, encouraged to decrease screen time prior to bed.  He has not undergone a sleep study, patient currently not amenable.  He is also not amenable for CBT-I.  He denied feeling depressed or anxious, denied any symptoms of PTSD or mania.  He denied using any substances including alcohol or cigarettes.  He reported compliance of  the medications, reported good therapeutic benefit, denied any side effects or any new physical concerns.  He has tapered off Lexapro without any side effects.  We discussed about continuing the same medications, prescription sent to the pharmacy, plan to follow-up in 2 months.   Past Psychiatric History:  Past psychiatric diagnoses: Insomnia Psychiatric  hospitalizations: None Past suicide attempts: Denies Hx of self harm: Denies Hx of violence towards others: Denies Prior psychiatric providers: None Prior therapy: None Access to firearms: Denies   Prior medication trials: Trazodone    Substance use: Denies  Past Medical History:  Past Medical History:  Diagnosis Date   Arthritis    BPH (benign prostatic hyperplasia)    Chronic pain    Depression    Erectile dysfunction    Hypertension    Myopia    Peripheral polyneuropathy    Pre-diabetes    Spinal stenosis     Past Surgical History:  Procedure Laterality Date   BACK SURGERY     x4   HERNIA REPAIR     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   Procedure: TOTAL HIP ARTHROPLASTY;  Surgeon: Carlos Chew, MD;  Location: [facility];  Service: Orthopedics;  Laterality: Right;   TRANSURETHRAL RESECTION OF PROSTATE N/A 04/22/2021   Procedure: TRANSURETHRAL RESECTION OF THE PROSTATE (TURP);  Surgeon: Carlos Donnice SAUNDERS, MD;  Location: [facility];  Service: Urology;  Laterality: N/A;   WISDOM TOOTH EXTRACTION      Family Psychiatric History: Nothing significant   Family History:  Family History  Problem Relation Age of Onset   Cancer Mother    Cancer Father     Social History:  Social History   Socioeconomic History   Marital status: Married    Spouse name: Not on file   Number of children: Not on file   Years of education: Not on file   Highest education level: Not on file  Occupational History   Not on file  Tobacco Use   Smoking status: Former    Current packs/day: 0.00    Average packs/day: 1 pack/day for 30.0 years (30.0 ttl pk-yrs)    Types: Cigarettes    Start date: 50    Quit date: 2011    Years since quitting: 14.7   Smokeless tobacco: Never  Vaping Use   Vaping status: Never Used  Substance and Sexual Activity   Alcohol use: Never   Drug use: Never   Sexual activity: Yes  Other Topics Concern   Not on file  Social History Narrative   Not on file    Social Drivers of Health   Financial Resource Strain: Not on file  Food Insecurity: Not on file  Transportation Needs: Not on file  Physical Activity: Not on file  Stress: Not on file  Social Connections: Not on file    Allergies: No Known Allergies  Current Medications: Current Outpatient Medications  Medication Sig Dispense Refill   aspirin  EC 325 MG tablet Take 1 tablet (325 mg total) by mouth 2 (two) times daily. 60 tablet 0   methocarbamol  (ROBAXIN ) 500 MG tablet Take 500 mg by mouth every 8 (eight) hours as needed for muscle spasms.     Oxycodone  HCl 10 MG TABS Take 10 mg by mouth every 4 (four) hours as needed (severe pain).     QUEtiapine  (SEROQUEL ) 50 MG tablet Take 1 tablet (50 mg total) by mouth at bedtime. 90 tablet 0   traZODone  (DESYREL ) 150 MG tablet Take 1 tablet (150 mg total) by  mouth at bedtime. 90 tablet 0   No current facility-administered medications for this visit.     Musculoskeletal: Strength & Muscle Tone: within normal limits Gait & Station: normal Patient leans: N/A  Psychiatric Specialty Exam: Blood pressure (!) 145/86, pulse 75, height 6' (1.829 m), weight 198 lb (89.8 kg).Body mass index is 26.85 kg/m. Review of Systems  General Appearance: Casual and Fairly Groomed  Eye Contact:  Good  Speech:  Clear and Coherent and Normal Rate  Volume:  Normal  Mood:  Euthymic  Affect:  Appropriate  Thought Content: Logical   Suicidal Thoughts:  No  Homicidal Thoughts:  No  Thought Process:  Linear  Orientation:  Full (Time, Place, and Person)    Memory: Immediate;   Fair Recent;   Fair Remote;   Fair  Judgment:  Intact  Insight:  Fair  Concentration:  Concentration: Good and Attention Span: Good  Recall:  not formally assessed   Fund of Knowledge: Good  Language: Good  Psychomotor Activity:  Normal  Akathisia:  No  AIMS (if indicated): not done  Assets:  Communication Skills Desire for Improvement Housing Leisure  Time Resilience Talents/Skills Vocational/Educational  ADL's:  Intact  Cognition: WNL  Sleep:  Fair   Metabolic Disorder Labs: Lab Results  Component Value Date   HGBA1C 5.7 (H) 10/13/2022   MPG 116.89 10/13/2022   MPG 122.63 04/15/2021   No results found for: PROLACTIN No results found for: CHOL, TRIG, HDL, CHOLHDL, VLDL, LDLCALC No results found for: TSH  Therapeutic Level Labs: No results found for: LITHIUM No results found for: VALPROATE No results found for: CBMZ   Screenings: PHQ2-9    Flowsheet Row [facility] Office Visit from 05/12/2024 in [facility]  PHQ-2 Total Score 0 0  PHQ-9 Total Score -- 6   Flowsheet Row Admission (Discharged) from 10/24/2022 in [facility] [facility] Pre-Admission Testing 60 from 10/13/2022 in [facility] Admission (Discharged) from 04/22/2021 in [facility] [facility] 4TH FLOOR PROGRESSIVE CARE AND UROLOGY  C-SSRS RISK CATEGORY No Risk No Risk No Risk    Collaboration of Care: Collaboration of Care: Other Dr. Mercy  Patient/Guardian was advised Release of Information must be obtained prior to any record release in order to collaborate their care with an outside provider. Patient/Guardian was advised if they have not already done so to contact the registration department to sign all necessary forms in order for us  to release information regarding their care.   Consent: Patient/Guardian gives verbal consent for treatment and assignment of benefits for services provided during this visit. Patient/Guardian expressed understanding and agreed to proceed.    Juston Goheen, MD 06/09/2024, 3:23 PM

## 2024-06-09 ENCOUNTER — Ambulatory Visit (HOSPITAL_COMMUNITY)

## 2024-06-09 DIAGNOSIS — F5101 Primary insomnia: Secondary | ICD-10-CM

## 2024-06-09 MED ORDER — TRAZODONE HCL 150 MG PO TABS: 150.0000 mg | ORAL_TABLET | Freq: Every day | ORAL | 0 refills | Status: DC

## 2024-06-09 MED ORDER — QUETIAPINE FUMARATE 50 MG PO TABS: 50.0000 mg | ORAL_TABLET | Freq: Every day | ORAL | 0 refills | Status: DC

## 2024-06-09 NOTE — Addendum Note (Signed)
 Addended by: MERCY DOMINO A on: 06/09/2024 05:16 PM   Modules accepted: Level of Service

## 2024-07-21 NOTE — Progress Notes (Deleted)
 BH MD/PA/NP OP Progress Note  07/21/2024 8:52 AM Carlos [facility]  MRN:  [id number]  Visit Diagnosis:  No diagnosis found.   Assessment:  Carlos Peterson is a 63 y.o. male with a history of insomnia on trazodone  who presents in person to [facility] at [facility] for  follow-up. Carlos Peterson presents for follow-up evaluation. Today, 07/21/24, patient reports improvement in his sleep, he is able to sleep consistently for at least 8 hours on the days he works, has no nightmares or flashbacks.  He has tolerated all the medications without any side effects.  He does report low energy on few days, discussed about decreasing trazodone  to 100 mg, he did not want to make any medication changes currently.  Sleep hygiene is still an issue, encouraged to decrease screen time, maintain a consistent sleep schedule, patient receptive.  He has not been using any substances including alcohol or cigarettes, is not actively or passively suicidal or homicidal.  He is not depressed or anxious and has been eating well.  Since the medications have been beneficial, plan to continue the same medication management.  Encouraged sleep study and CBT-I, patient currently not amenable.  We will have him back in the clinic in 2 months.  If Seroquel  continues to have good therapeutic benefit, plan to taper down trazodone  and have him only on Seroquel  for sleep.  Risk Assessment: An assessment of suicide and violence risk factors was performed as part of this evaluation and is not significantly changed from the last visit. While future psychiatric events cannot be accurately predicted, the patient does not currently require acute inpatient psychiatric care and does not currently meet [place]  involuntary commitment criteria. Patient was given contact information for crisis resources, behavioral health clinic and was instructed to call 911 for emergencies.     # Chronic insomnia Past medication trials:  Trazodone , mirtazapine, escitalopram Status of problem: New to the clinical research associate, chronic Interventions: -- Continue trazodone  150 mg nightly -- Continue Seroquel  50 mg nightly -- Provided sleep education, reducing screen time before bed -- Ordered sleep study to rule out sleep apnea -- Patient not amenable for CBT-I  Chief Complaint:  No chief complaint on file.  HPI:  Carlos Peterson is a 63 year old male with no past significant psychiatric history apart from chronic insomnia that presents to the clinic for psychiatric follow-up for insomnia.  Today, patient reported his mood is good .  He denied any active or passive SI/HI/AVH.  He reported good appetite and reported his sleep as better .  Stated that he sleeps at 8:30 PM, wakes up at 5 AM every day, is asleep throughout the night, denied any nightmares flashbacks.  Reported that he sometimes shouts and sleep which his wife has reported.  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  He reported that he sometimes watches television for 2 hours prior to bedtime on the days he is off the next day, discussed sleep hygiene, encouraged to decrease screen time prior to bed.  He has not undergone a sleep study, patient currently not amenable.  He is also not amenable for CBT-I.  He denied feeling depressed or anxious, denied any symptoms of PTSD or mania.  He denied using any substances including alcohol or cigarettes.  He reported compliance of the medications, reported good therapeutic benefit, denied any side effects or any new physical concerns.  He has tapered off Lexapro without any side effects.  We discussed about continuing the same medications, prescription sent  to the pharmacy, plan to follow-up in 2 months.   Past Psychiatric History:  Past psychiatric diagnoses: Insomnia Psychiatric hospitalizations: None Past suicide attempts: Denies Hx of self harm: Denies Hx of violence towards others: Denies Prior psychiatric providers: None Prior  therapy: None Access to firearms: Denies   Prior medication trials: Trazodone    Substance use: Denies  Past Medical History:  Past Medical History:  Diagnosis Date   Arthritis    BPH (benign prostatic hyperplasia)    Chronic pain    Depression    Erectile dysfunction    Hypertension    Myopia    Peripheral polyneuropathy    Pre-diabetes    Spinal stenosis     Past Surgical History:  Procedure Laterality Date   BACK SURGERY     x4   HERNIA REPAIR     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   Procedure: TOTAL HIP ARTHROPLASTY;  Surgeon: Josefina Chew, MD;  Location: [facility];  Service: Orthopedics;  Laterality: Right;   TRANSURETHRAL RESECTION OF PROSTATE N/A 04/22/2021   Procedure: TRANSURETHRAL RESECTION OF THE PROSTATE (TURP);  Surgeon: Selma Donnice SAUNDERS, MD;  Location: [facility];  Service: Urology;  Laterality: N/A;   WISDOM TOOTH EXTRACTION      Family Psychiatric History: Nothing significant   Family History:  Family History  Problem Relation Age of Onset   Cancer Mother    Cancer Father     Social History:  Social History   Socioeconomic History   Marital status: Married    Spouse name: Not on file   Number of children: Not on file   Years of education: Not on file   Highest education level: Not on file  Occupational History   Not on file  Tobacco Use   Smoking status: Former    Current packs/day: 0.00    Average packs/day: 1 pack/day for 30.0 years (30.0 ttl pk-yrs)    Types: Cigarettes    Start date: 73    Quit date: 2011    Years since quitting: 14.8   Smokeless tobacco: Never  Vaping Use   Vaping status: Never Used  Substance and Sexual Activity   Alcohol use: Never   Drug use: Never   Sexual activity: Yes  Other Topics Concern   Not on file  Social History Narrative   Not on file   Social Drivers of Health   Financial Resource Strain: Not on file  Food Insecurity: Not on file  Transportation Needs: Not on file  Physical Activity: Not  on file  Stress: Not on file  Social Connections: Not on file    Allergies: No Known Allergies  Current Medications: Current Outpatient Medications  Medication Sig Dispense Refill   aspirin  EC 325 MG tablet Take 1 tablet (325 mg total) by mouth 2 (two) times daily. 60 tablet 0   methocarbamol  (ROBAXIN ) 500 MG tablet Take 500 mg by mouth every 8 (eight) hours as needed for muscle spasms.     Oxycodone  HCl 10 MG TABS Take 10 mg by mouth every 4 (four) hours as needed (severe pain).     QUEtiapine  (SEROQUEL ) 50 MG tablet Take 1 tablet (50 mg total) by mouth at bedtime. 90 tablet 0   traZODone  (DESYREL ) 150 MG tablet Take 1 tablet (150 mg total) by mouth at bedtime. 90 tablet 0   No current facility-administered medications for this visit.     Musculoskeletal: Strength & Muscle Tone: within normal limits Gait & Station: normal Patient leans: N/A  Psychiatric Specialty Exam: There were no vitals taken for this visit.There is no height or weight on file to calculate BMI. Review of Systems  General Appearance: Casual and Fairly Groomed  Eye Contact:  Good  Speech:  Clear and Coherent and Normal Rate  Volume:  Normal  Mood:  Euthymic  Affect:  Appropriate  Thought Content: Logical   Suicidal Thoughts:  No  Homicidal Thoughts:  No  Thought Process:  Linear  Orientation:  Full (Time, Place, and Person)    Memory: Immediate;   Fair Recent;   Fair Remote;   Fair  Judgment:  Intact  Insight:  Fair  Concentration:  Concentration: Good and Attention Span: Good  Recall:  not formally assessed   Fund of Knowledge: Good  Language: Good  Psychomotor Activity:  Normal  Akathisia:  No  AIMS (if indicated): not done  Assets:  Communication Skills Desire for Improvement Housing Leisure Time Resilience Talents/Skills Vocational/Educational  ADL's:  Intact  Cognition: WNL  Sleep:  Fair   Metabolic Disorder Labs: Lab Results  Component Value Date   HGBA1C 5.7 (H) 10/13/2022    MPG 116.89 10/13/2022   MPG 122.63 04/15/2021   No results found for: PROLACTIN No results found for: CHOL, TRIG, HDL, CHOLHDL, VLDL, LDLCALC No results found for: TSH  Therapeutic Level Labs: No results found for: LITHIUM No results found for: VALPROATE No results found for: CBMZ   Screenings: PHQ2-9    Flowsheet Row [facility] Visit [facility] Office Visit from 05/12/2024 in [facility]  PHQ-2 Total Score 0 0  PHQ-9 Total Score -- 6   Flowsheet Row Admission (Discharged) from 10/24/2022 in [place] [facility]-3 [facility] Pre-Admission Testing 60 from 10/13/2022 in [facility] Admission (Discharged) from 04/22/2021 in [place] LONG 4TH FLOOR PROGRESSIVE CARE AND UROLOGY  C-SSRS RISK CATEGORY No Risk No Risk No Risk    Collaboration of Care: Collaboration of Care: Other Dr. Mercy  Patient/Guardian was advised Release of Information must be obtained prior to any record release in order to collaborate their care with an outside provider. Patient/Guardian was advised if they have not already done so to contact the registration department to sign all necessary forms in order for us  to release information regarding their care.   Consent: Patient/Guardian gives verbal consent for treatment and assignment of benefits for services provided during this visit. Patient/Guardian expressed understanding and agreed to proceed.    Raymona Boss, MD 07/21/2024, 8:52 AM

## 2024-08-04 ENCOUNTER — Ambulatory Visit (HOSPITAL_COMMUNITY)

## 2024-08-04 ENCOUNTER — Other Ambulatory Visit (HOSPITAL_COMMUNITY): Payer: Self-pay

## 2024-08-04 ENCOUNTER — Encounter (HOSPITAL_COMMUNITY): Payer: Self-pay

## 2024-08-04 DIAGNOSIS — F5101 Primary insomnia: Secondary | ICD-10-CM

## 2024-08-04 MED ORDER — TRAZODONE HCL 150 MG PO TABS: 150.0000 mg | ORAL_TABLET | Freq: Every day | ORAL | 2 refills | Status: DC

## 2024-08-04 MED ORDER — QUETIAPINE FUMARATE 50 MG PO TABS: 50.0000 mg | ORAL_TABLET | Freq: Every day | ORAL | 2 refills | Status: DC

## 2024-08-04 NOTE — Progress Notes (Signed)
 Patient had an appointment today, however he had to cancel it due to work.  Call the patient and he reported that he would call the clinic to schedule the next appointment.  Provided him refills since he had issues filling a 90-day supply, provided him with 30-day supply of both Seroquel  and trazodone  with refill to cover until his next appointment.  Shacora Zynda

## 2024-08-26 ENCOUNTER — Telehealth (HOSPITAL_COMMUNITY): Payer: Self-pay | Admitting: *Deleted

## 2024-08-26 NOTE — Telephone Encounter (Signed)
 Writer returned pt call regarding refilling Seroquel . Pt has refills @ [facility] on [address]. However pt VM is full.

## 2024-08-26 NOTE — Telephone Encounter (Signed)
 Pt returned call to office regarding Seroquel  50 mg  refill. Pt states he cannot refill medication because pharmacy advised pt it is too early to fill. Pt says he has been taking 100 mg at bedtime as he says the 50 mg was not working anymore. Pt would like a script for the Seroquel  100 mg at bedtime, as he says he does not have enough left to make it to next visit.    Last visit: 10/96/25 Next visit: 09/15/24

## 2024-09-15 ENCOUNTER — Ambulatory Visit (HOSPITAL_COMMUNITY)

## 2024-09-17 ENCOUNTER — Other Ambulatory Visit (HOSPITAL_COMMUNITY): Payer: Self-pay

## 2024-09-17 DIAGNOSIS — F5101 Primary insomnia: Secondary | ICD-10-CM

## 2024-09-17 NOTE — Progress Notes (Signed)
 BH MD/PA/NP OP Progress Note  09/22/2024 3:42 PM Carlos Peterson  MRN:  [id number]  Visit Diagnosis:    ICD-10-CM   1. Primary insomnia  F51.01 QUEtiapine  (SEROQUEL ) 100 MG tablet    traZODone  (DESYREL ) 100 MG tablet       Assessment:  Carlos Peterson is a 64 y.o. male with a history of insomnia on trazodone  who presents in person to [facility] for  follow-up. Carlos Peterson presents for follow-up evaluation. Today, 09/22/24, patient reports no recent changes with her sleep after he recently increased the dose of Seroquel  to 100 mg nightly in addition to trazodone  150 mg.  We discussed the risk benefits and side effects of both medications and decreasing trazodone  since that has been ineffective for him, he was amenable to the plan.  He is not depressed or anxious, has been eating well however his sleep schedule has been fragmented due to his work schedule as he works every other day.  Discussed sleep hygiene, encouraged him to get CBT.  Also discussed to decrease screen time, maintaining a consistent sleep schedule, patient was receptive to it.  He does not use any substances including alcohol or cigarettes and is not actively or passively homicidal or suicidal and there are no safety concerns.  Since her medications have had significant benefit, plan to continue Seroquel  at 100 mg and decrease trazodone  to 100 mg to prevent polypharmacy.  Initially he was hesitant to schedule a visit due to a no-show charge, encouraged him to schedule one in 10 to 12 weeks and call the clinic if he needed an earlier appointment.  Risk Assessment: An assessment of suicide and violence risk factors was performed as part of this evaluation and is not significantly changed from the last visit. While future psychiatric events cannot be accurately predicted, the patient does not currently require acute inpatient psychiatric care and does not currently meet [place]  involuntary commitment  criteria. Patient was given contact information for crisis resources, behavioral health clinic and was instructed to call 911 for emergencies.     # Chronic insomnia Past medication trials: Trazodone , mirtazapine, escitalopram Status of problem: New to the clinical research associate, chronic Interventions: -- Continue trazodone  100 mg nightly -- Continue Seroquel  100 mg nightly -- Provided sleep education, reducing screen time before bed -- Ordered sleep study to rule out sleep apnea -- Patient not amenable for CBT-I  Chief Complaint:  Chief Complaint  Patient presents with   Follow-up   Medication Refill   HPI:  Carlos Peterson is a 64 year old male with no past significant psychiatric history apart from chronic insomnia that presents to the clinic for psychiatric follow-up for insomnia.  Today, he reported his mood as good .  He denied any active or passive SI/HI/AVH.  When asked about sleep and appetite he stated better .  Reported that he sleeps between 9 PM to 5:30 AM on the day he works and goes to bed later in the night on the days he does not work.  His sleep schedule has been fragmented as he works every alternate days. He denied any nightmares or flashbacks.  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  Reported depression and anxiety as no .  He denied any symptoms of mania or psychosis.  Denied using any substances including alcohol or cigarettes.  Still has not undergone sleep study, not amenable currently.  He reported taking his medicines every day, denied any side effects or  any new physical concerns.  We discussed about his medications taking Seroquel  100 mg and decreasing trazodone  to 100 mg as well, prescription sent to the preferred pharmacy, will have him back in the clinic in 12 weeks.  Past Psychiatric History:  Past psychiatric diagnoses: Insomnia Psychiatric hospitalizations: None Past suicide attempts: Denies Hx of self harm: Denies Hx of violence towards  others: Denies Prior psychiatric providers: None Prior therapy: None Access to firearms: Denies   Prior medication trials: Trazodone    Substance use: Denies  Past Medical History:  Past Medical History:  Diagnosis Date   Arthritis    BPH (benign prostatic hyperplasia)    Chronic pain    Depression    Erectile dysfunction    Hypertension    Myopia    Peripheral polyneuropathy    Pre-diabetes    Spinal stenosis     Past Surgical History:  Procedure Laterality Date   BACK SURGERY     x4   HERNIA REPAIR     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   Procedure: TOTAL HIP ARTHROPLASTY;  Surgeon: Josefina Chew, MD;  Location: [facility];  Service: Orthopedics;  Laterality: Right;   TRANSURETHRAL RESECTION OF PROSTATE N/A 04/22/2021   Procedure: TRANSURETHRAL RESECTION OF THE PROSTATE (TURP);  Surgeon: Selma Donnice SAUNDERS, MD;  Location: [facility];  Service: Urology;  Laterality: N/A;   WISDOM TOOTH EXTRACTION      Family Psychiatric History: Nothing significant   Family History:  Family History  Problem Relation Age of Onset   Cancer Mother    Cancer Father     Social History:  Social History   Socioeconomic History   Marital status: Married    Spouse name: Not on file   Number of children: Not on file   Years of education: Not on file   Highest education level: Not on file  Occupational History   Not on file  Tobacco Use   Smoking status: Former    Current packs/day: 0.00    Average packs/day: 1 pack/day for 30.0 years (30.0 ttl pk-yrs)    Types: Cigarettes    Start date: 22    Quit date: 2011    Years since quitting: 15.0   Smokeless tobacco: Never  Vaping Use   Vaping status: Never Used  Substance and Sexual Activity   Alcohol use: Never   Drug use: Never   Sexual activity: Yes  Other Topics Concern   Not on file  Social History Narrative   Not on file   Social Drivers of Health   Tobacco Use: Medium Risk (05/12/2024)   Patient History    Smoking Tobacco  Use: Former    Smokeless Tobacco Use: Never    Passive Exposure: Not on [place] Strain: Not on file  Food Insecurity: Not on file  Transportation Needs: Not on file  Physical Activity: Not on file  Stress: Not on file  Social Connections: Not on file  Depression (PHQ2-9): Low Risk (06/09/2024)   Depression (PHQ2-9)    PHQ-2 Score: 0  Recent Concern: Depression (PHQ2-9) - Medium Risk (05/12/2024)   Depression (PHQ2-9)    PHQ-2 Score: 6  Alcohol Screen: Not on file  Housing: Not on file  Utilities: Not on file  Health Literacy: Not on file    Allergies: No Known Allergies  Current Medications: Current Outpatient Medications  Medication Sig Dispense Refill   aspirin  EC 325 MG tablet Take 1 tablet (325 mg total) by mouth 2 (two) times daily.  60 tablet 0   methocarbamol  (ROBAXIN ) 500 MG tablet Take 500 mg by mouth every 8 (eight) hours as needed for muscle spasms.     Oxycodone  HCl 10 MG TABS Take 10 mg by mouth every 4 (four) hours as needed (severe pain).     QUEtiapine  (SEROQUEL ) 100 MG tablet Take 1 tablet (100 mg total) by mouth at bedtime. 90 tablet 0   traZODone  (DESYREL ) 100 MG tablet Take 1 tablet (100 mg total) by mouth at bedtime. 90 tablet 0   No current facility-administered medications for this visit.     Musculoskeletal: Strength & Muscle Tone: within normal limits Gait & Station: normal Patient leans: N/A  Psychiatric Specialty Exam: There were no vitals taken for this visit.There is no height or weight on file to calculate BMI. Review of Systems  General Appearance: Casual and Fairly Groomed  Eye Contact:  Good  Speech:  Clear and Coherent and Normal Rate  Volume:  Normal  Mood:  Euthymic  Affect:  Appropriate  Thought Content: Logical   Suicidal Thoughts:  No  Homicidal Thoughts:  No  Thought Process:  Linear  Orientation:  Full (Time, Place, and Person)    Memory: Immediate;   Fair Recent;   Fair Remote;   Fair  Judgment:  Intact   Insight:  Fair  Concentration:  Concentration: Good and Attention Span: Good  Recall:  not formally assessed   Fund of Knowledge: Good  Language: Good  Psychomotor Activity:  Normal  Akathisia:  No  AIMS (if indicated): not done  Assets:  Communication Skills Desire for Improvement Housing Leisure Time Resilience Talents/Skills Vocational/Educational  ADL's:  Intact  Cognition: WNL  Sleep:  Fair   Metabolic Disorder Labs: Lab Results  Component Value Date   HGBA1C 5.7 (H) 10/13/2022   MPG 116.89 10/13/2022   MPG 122.63 04/15/2021   No results found for: PROLACTIN No results found for: CHOL, TRIG, HDL, CHOLHDL, VLDL, LDLCALC No results found for: TSH  Therapeutic Level Labs: No results found for: LITHIUM No results found for: VALPROATE No results found for: CBMZ   Screenings: PHQ2-9    Flowsheet Row [facility] Visit [facility] in [facility] Office Visit from 05/12/2024 in [facility]  PHQ-2 Total Score 0 0  PHQ-9 Total Score -- 6   Flowsheet Row Admission (Discharged) from 10/24/2022 in [facility] Pre-Admission Testing 60 from 10/13/2022 in [facility] Admission (Discharged) from 04/22/2021 in [facility]  C-SSRS RISK CATEGORY No Risk No Risk No Risk    Collaboration of Care: Collaboration of Care: Other Dr. Mercy  Patient/Guardian was advised Release of Information must be obtained prior to any record release in order to collaborate their care with an outside provider. Patient/Guardian was advised if they have not already done so to contact the registration department to sign all necessary forms in order for us  to release information regarding their care.   Consent: Patient/Guardian gives verbal consent for treatment and assignment of benefits for services provided  during this visit. Patient/Guardian expressed understanding and agreed to proceed.    Joban Colledge, MD 09/22/2024, 3:42 PM

## 2024-09-22 ENCOUNTER — Encounter (HOSPITAL_COMMUNITY): Payer: Self-pay

## 2024-09-22 ENCOUNTER — Ambulatory Visit (HOSPITAL_COMMUNITY)

## 2024-09-22 DIAGNOSIS — F5101 Primary insomnia: Secondary | ICD-10-CM | POA: Diagnosis not present

## 2024-09-22 MED ORDER — QUETIAPINE FUMARATE 100 MG PO TABS: 100.0000 mg | ORAL_TABLET | Freq: Every evening | ORAL | 0 refills | Status: AC

## 2024-09-22 MED ORDER — TRAZODONE HCL 100 MG PO TABS: 100.0000 mg | ORAL_TABLET | Freq: Every day | ORAL | 0 refills | Status: AC

## 2024-09-23 NOTE — Addendum Note (Signed)
 Addended by: CARVIN CROCK on: 09/23/2024 11:40 AM   Modules accepted: Level of Service

## 2024-12-15 ENCOUNTER — Ambulatory Visit (HOSPITAL_COMMUNITY)
